# Patient Record
Sex: Male | Born: 1940
Health system: Southern US, Community
[De-identification: ages and names within clinical notes are randomized; demographics above are authoritative.]

## PROBLEM LIST (undated history)

## (undated) DIAGNOSIS — N529 Male erectile dysfunction, unspecified: Secondary | ICD-10-CM

## (undated) DIAGNOSIS — K5790 Diverticulosis of intestine, part unspecified, without perforation or abscess without bleeding: Secondary | ICD-10-CM

## (undated) DIAGNOSIS — S4990XA Unspecified injury of shoulder and upper arm, unspecified arm, initial encounter: Secondary | ICD-10-CM

## (undated) DIAGNOSIS — C189 Malignant neoplasm of colon, unspecified: Secondary | ICD-10-CM

## (undated) DIAGNOSIS — Z8669 Personal history of other diseases of the nervous system and sense organs: Secondary | ICD-10-CM

## (undated) DIAGNOSIS — G4700 Insomnia, unspecified: Secondary | ICD-10-CM

## (undated) DIAGNOSIS — S83209A Unspecified tear of unspecified meniscus, current injury, unspecified knee, initial encounter: Secondary | ICD-10-CM

## (undated) DIAGNOSIS — H9190 Unspecified hearing loss, unspecified ear: Secondary | ICD-10-CM

## (undated) DIAGNOSIS — R350 Frequency of micturition: Secondary | ICD-10-CM

## (undated) DIAGNOSIS — R351 Nocturia: Secondary | ICD-10-CM

## (undated) DIAGNOSIS — E785 Hyperlipidemia, unspecified: Secondary | ICD-10-CM

## (undated) DIAGNOSIS — M7020 Olecranon bursitis, unspecified elbow: Secondary | ICD-10-CM

## (undated) DIAGNOSIS — Z8719 Personal history of other diseases of the digestive system: Secondary | ICD-10-CM

## (undated) DIAGNOSIS — W19XXXA Unspecified fall, initial encounter: Secondary | ICD-10-CM

## (undated) DIAGNOSIS — Z9221 Personal history of antineoplastic chemotherapy: Secondary | ICD-10-CM

## (undated) DIAGNOSIS — D179 Benign lipomatous neoplasm, unspecified: Secondary | ICD-10-CM

## (undated) DIAGNOSIS — C61 Malignant neoplasm of prostate: Secondary | ICD-10-CM

## (undated) DIAGNOSIS — M1990 Unspecified osteoarthritis, unspecified site: Secondary | ICD-10-CM

## (undated) DIAGNOSIS — H269 Unspecified cataract: Secondary | ICD-10-CM

## (undated) DIAGNOSIS — Z923 Personal history of irradiation: Secondary | ICD-10-CM

## (undated) DIAGNOSIS — H7090 Unspecified mastoiditis, unspecified ear: Secondary | ICD-10-CM

## (undated) DIAGNOSIS — K219 Gastro-esophageal reflux disease without esophagitis: Secondary | ICD-10-CM

## (undated) DIAGNOSIS — Z85038 Personal history of other malignant neoplasm of large intestine: Secondary | ICD-10-CM

## (undated) DIAGNOSIS — J309 Allergic rhinitis, unspecified: Secondary | ICD-10-CM

## (undated) HISTORY — PX: TONSILLECTOMY: SUR1361

## (undated) HISTORY — PX: OTHER SURGICAL HISTORY: SHX169

## (undated) HISTORY — DX: Allergic rhinitis, unspecified: J30.9

## (undated) HISTORY — DX: Diverticulosis of intestine, part unspecified, without perforation or abscess without bleeding: K57.90

## (undated) HISTORY — PX: VASECTOMY: SHX75

## (undated) HISTORY — PX: COLONOSCOPY: SHX174

## (undated) HISTORY — DX: Gastro-esophageal reflux disease without esophagitis: K21.9

## (undated) HISTORY — DX: Olecranon bursitis, unspecified elbow: M70.20

## (undated) HISTORY — DX: Benign lipomatous neoplasm, unspecified: D17.9

## (undated) HISTORY — DX: Hyperlipidemia, unspecified: E78.5

## (undated) HISTORY — DX: Malignant neoplasm of prostate: C61

## (undated) HISTORY — PX: PROSTATE BIOPSY: SHX241

## (undated) HISTORY — PX: KNEE ARTHROSCOPY: SUR90

## (undated) HISTORY — DX: Insomnia, unspecified: G47.00

---

## 1990-05-18 DIAGNOSIS — C189 Malignant neoplasm of colon, unspecified: Secondary | ICD-10-CM

## 1990-05-18 HISTORY — DX: Malignant neoplasm of colon, unspecified: C18.9

## 1992-05-18 HISTORY — PX: COLON SURGERY: SHX602

## 1999-04-24 ENCOUNTER — Encounter: Payer: Self-pay | Admitting: Hematology and Oncology

## 1999-04-24 ENCOUNTER — Encounter: Admission: RE | Admit: 1999-04-24 | Discharge: 1999-04-24 | Payer: Self-pay | Admitting: Hematology and Oncology

## 2001-04-18 ENCOUNTER — Ambulatory Visit (HOSPITAL_COMMUNITY): Admission: RE | Admit: 2001-04-18 | Discharge: 2001-04-18 | Payer: Self-pay | Admitting: Gastroenterology

## 2012-10-04 ENCOUNTER — Other Ambulatory Visit: Payer: Self-pay | Admitting: Internal Medicine

## 2012-10-04 DIAGNOSIS — R1310 Dysphagia, unspecified: Secondary | ICD-10-CM

## 2012-10-17 ENCOUNTER — Ambulatory Visit
Admission: RE | Admit: 2012-10-17 | Discharge: 2012-10-17 | Disposition: A | Discharge status: Home / Self Care | Payer: Medicare Other | Source: Ambulatory Visit | Attending: Internal Medicine | Admitting: Internal Medicine

## 2012-10-17 DIAGNOSIS — R1310 Dysphagia, unspecified: Secondary | ICD-10-CM

## 2014-04-17 DIAGNOSIS — C61 Malignant neoplasm of prostate: Secondary | ICD-10-CM | POA: Insufficient documentation

## 2014-05-03 ENCOUNTER — Other Ambulatory Visit (HOSPITAL_COMMUNITY): Payer: Self-pay | Admitting: Urology

## 2014-05-03 DIAGNOSIS — C61 Malignant neoplasm of prostate: Secondary | ICD-10-CM

## 2014-05-16 ENCOUNTER — Encounter (HOSPITAL_COMMUNITY)
Admission: RE | Admit: 2014-05-16 | Discharge: 2014-05-16 | Disposition: A | Discharge status: Home / Self Care | Payer: Medicare Other | Source: Ambulatory Visit | Attending: Urology | Admitting: Urology

## 2014-05-16 DIAGNOSIS — C61 Malignant neoplasm of prostate: Secondary | ICD-10-CM | POA: Diagnosis not present

## 2014-05-16 MED ORDER — TECHNETIUM TC 99M MEDRONATE IV KIT
25.6000 | PACK | Freq: Once | INTRAVENOUS | Status: AC | PRN
  Administered 2014-05-16: 25.6 via INTRAVENOUS

## 2014-05-18 DIAGNOSIS — C61 Malignant neoplasm of prostate: Secondary | ICD-10-CM

## 2014-05-18 HISTORY — DX: Malignant neoplasm of prostate: C61

## 2014-05-23 ENCOUNTER — Other Ambulatory Visit (HOSPITAL_COMMUNITY): Payer: Self-pay | Admitting: Urology

## 2014-05-23 ENCOUNTER — Ambulatory Visit (HOSPITAL_COMMUNITY)
Admission: RE | Admit: 2014-05-23 | Discharge: 2014-05-23 | Disposition: A | Discharge status: Home / Self Care | Payer: Medicare Other | Source: Ambulatory Visit | Attending: Urology | Admitting: Urology

## 2014-05-23 DIAGNOSIS — C61 Malignant neoplasm of prostate: Secondary | ICD-10-CM | POA: Diagnosis not present

## 2014-05-23 DIAGNOSIS — M24152 Other articular cartilage disorders, left hip: Secondary | ICD-10-CM | POA: Diagnosis not present

## 2014-05-23 DIAGNOSIS — Z8546 Personal history of malignant neoplasm of prostate: Secondary | ICD-10-CM | POA: Diagnosis not present

## 2014-06-11 NOTE — Progress Notes (Signed)
Radiation Oncology         [phone]) [phone] ________________________________  Initial outpatient Consultation  Name: Steven Weeks  MRN: [id number]  Date: 06/13/2014  DOB: Oct 13, 1940  VF:IEPP,IRJJOACZYS R, MD  Claybon Jabs, MD   REFERRING PHYSICIAN: Claybon Jabs, MD  DIAGNOSIS: 74 y.o. gentleman with stage T2a adenocarcinoma of the prostate with a Gleason's score of 4+4 and a PSA of 7.645    ICD-9-CM ICD-10-CM   1. Stage T2a Adenocarcinoma of the Prostate with a Gleason's Score of 4+4 and a PSA of 7.65 185 C61     HISTORY OF PRESENT ILLNESS::Steven Weeks is a 74 y.o. gentleman.  He was noted to have an elevated PSA of 5.5 with short interval follow-up of 7.645 by his primary care physician, Dr. Dagmar Hait.  Accordingly, he was referred for evaluation in urology by Dr. Karsten Ro and digital rectal examination was performed at that time revealing a left apical nodule.  The patient proceeded to transrectal ultrasound with 12 biopsies of the prostate on 04/17/14.  The prostate volume measured 56.82 cc.  A hypoechoic region was noted on the left side, but, appeared to be confined to the prostate capsule.  Out of 12 core biopsies, 4 were positive.  The maximum Gleason score was 4+4, and this was seen in the graphical depiction below on the left side.  The patient reviewed the biopsy results with his urologist and he has kindly been referred today for discussion of potential radiation treatment options.  He was started on Lupron on 05/22/14  PREVIOUS RADIATION THERAPY: No  PAST MEDICAL HISTORY:  has a past medical history of Prostate cancer; Diverticulosis; Allergic rhinitis; GERD (gastroesophageal reflux disease); Hyperlipidemia; Insomnia; Malignant neoplasm of colon; Arthritis; Multiple lipomas; Olecranon bursitis; and S/P chemotherapy, time since greater than 12 weeks.    PAST SURGICAL HISTORY: Past Surgical History  Procedure Laterality Date  . Prostate biopsy    . Knee arthroscopy      with  medial meniscus repair  . Colon resection      due to malignancy    FAMILY HISTORY: family history includes Cancer in his father and mother; Hypertension in his mother.  SOCIAL HISTORY:  reports that he quit smoking about 33 years ago. His smoking use included Cigarettes. He does not have any smokeless tobacco history on file. He reports that he drinks about 2.4 oz of alcohol per week. He reports that he does not use illicit drugs.  ALLERGIES: Review of patient's allergies indicates no known allergies.  MEDICATIONS:  Current Outpatient Prescriptions  Medication Sig Dispense Refill  . aspirin 81 MG tablet Take 81 mg by mouth daily.    Marland Kitchen ibuprofen (ADVIL,MOTRIN) 800 MG tablet Take 800 mg by mouth every 8 (eight) hours as needed.    Marland Kitchen Leuprolide Acetate, 6 Month, (LUPRON) 45 MG injection Inject 45 mg into the muscle every 6 (six) months.    Marland Kitchen omeprazole (PRILOSEC) 20 MG capsule Take 20 mg by mouth daily.    . sildenafil (VIAGRA) 25 MG tablet Take 25 mg by mouth daily as needed for erectile dysfunction.    . temazepam (RESTORIL) 15 MG capsule Take 15 mg by mouth at bedtime as needed for sleep.     No current facility-administered medications for this encounter.    REVIEW OF SYSTEMS:  A 15 point review of systems is documented in the electronic medical record. This was obtained by the nursing staff. However, I reviewed this with the patient to discuss relevant  findings and make appropriate changes.  A comprehensive review of systems was negative..  The patient completed an IPSS and IIEF questionnaire.  His IPSS score was 2 indicating mild urinary outflow obstructive symptoms.  He indicated that his erectile function is not a priority.   PHYSICAL EXAM: This patient is in no acute distress.  He is alert and oriented.   weight is 201 lb 11.2 oz (91.491 kg). His blood pressure is 163/81 and his pulse is 71. His respiration is 16 and oxygen saturation is 100%.  He exhibits no respiratory distress or  labored breathing.  He appears neurologically intact.  His mood is pleasant.  His affect is appropriate.  Please note the digital rectal exam findings described above.  KPS = 100  100 - Normal; no complaints; no evidence of disease. 90   - Able to carry on normal activity; minor signs or symptoms of disease. 80   - Normal activity with effort; some signs or symptoms of disease. 49   - Cares for self; unable to carry on normal activity or to do active work. 60   - Requires occasional assistance, but is able to care for most of his personal needs. 50   - Requires considerable assistance and frequent medical care. 25   - Disabled; requires special care and assistance. 78   - Severely disabled; hospital admission is indicated although death not imminent. 47   - Very sick; hospital admission necessary; active supportive treatment necessary. 10   - Moribund; fatal processes progressing rapidly. 0     - Dead  Karnofsky DA, Abelmann [place], Craver LS and Burchenal JH [phone]) The use of the nitrogen mustards in the palliative treatment of carcinoma: with particular reference to bronchogenic carcinoma Cancer 1 634-56   LABORATORY DATA:  No results found for: WBC, HGB, HCT, MCV, PLT No results found for: NA, K, CL, CO2 No results found for: ALT, AST, GGT, ALKPHOS, BILITOT   RADIOGRAPHY: Nm Bone Scan Whole Body  05/16/2014   CLINICAL DATA:  Prostate cancer.  EXAM: NUCLEAR MEDICINE WHOLE BODY BONE SCAN  TECHNIQUE: Whole body anterior and posterior images were obtained approximately 3 hours after intravenous injection of radiopharmaceutical.  RADIOPHARMACEUTICALS:  25.6 mCi Technetium-99 MDP  COMPARISON:  CT 05/16/2014.  FINDINGS: Bilateral renal function and excretion. Intense increased activity noted over the right shoulder/ right humeral head. Right shoulder series suggested for further evaluation. These changes could be degenerative however metastatic disease cannot be excluded. Prominent activity noted  over the left hip. These changes could also be degenerative however metastatic disease cannot be excluded and left hip series suggested for further evaluation. Intense activity noted over the right knee, most likely degenerative, right knee series suggest for further evaluation.  IMPRESSION: 1. Intense activity noted of the right shoulder. 2. Prominent activity noted about the left hip. 3. Intense activity right knee. Although these findings may be secondary to degenerative changes. Right shoulder series, left hip series, and right knee series suggested for further evaluation as metastatic disease cannot be completely excluded.   Electronically Signed   By: Marcello Moores  Register   On: 05/16/2014 12:52   Dg Hip Unilat With Pelvis Min 4 Views Left  05/23/2014   CLINICAL DATA:  74 year old male with increased activity overlying the left hip on recent whole body bone scan. History of prostate cancer.  EXAM: DG HIP W/ PELVIS 4+V*L*  COMPARISON:  05/16/2014 bone scan and CT  FINDINGS: Mild to moderate degenerative changes in the left hip  per noted accounting for increased activity on recent bone scan. No focal bony lesions are identified.  Mild degenerative changes in the right hip are noted.  Degenerative changes in the lower lumbar spine are present.  IMPRESSION: Mild to moderate degenerative changes in the left hip accounting for increased activity on recent bone scan.   Electronically Signed   By: Hassan Rowan M.D.   On: 05/23/2014 12:54      IMPRESSION: This gentleman is a 74 y.o. gentleman with stage T2a adenocarcinoma of the prostate with a Gleason's score of 4+4 and a PSA of 7.645.  His Gleason's Score puts him into the high risk group.  Accordingly he is eligible for a variety of potential treatment options including external beam radiotherapy with or without seed implant boost using androgen deprivation for 2-3 years. In this setting, external radiation with seed implant has been shown to lead to superior clinical  outcomes with the trade-off of causing some increased urinary toxicity. The patient's low IPSS score indicates that he may tolerate a seed implant boost well.Marland Kitchen  PLAN:Today I reviewed the findings and workup thus far.  We discussed the natural history of prostate cancer.  We reviewed the the implications of T-stage, Gleason's Score, and PSA on decision-making and outcomes in prostate cancer.  We discussed radiation treatment in the management of prostate cancer with regard to the logistics and delivery of external beam radiation treatment as well as the logistics and delivery of prostate brachytherapy.  We compared and contrasted each of these approaches and also compared these against prostatectomy.  The patient expressed interest in external beam radiotherapy followed by seed implant boost.  I filled out a patient counseling form for him with relevant treatment diagrams and we retained a copy for our records.   The patient would like to proceed with prostate radiotherapy with external radiation followed by seed implant.  I will share my findings with Dr. Karsten Ro and move forward with scheduling placement of three gold fiducial markers into the prostate in mid to late February to proceed with external beam irradiation 2 months following initiation of androgen deprivation in early March.     I enjoyed meeting with him today, and will look forward to participating in the care of this very nice gentleman.   I spent 60 minutes face to face with the patient and more than 50% of that time was spent in counseling and/or coordination of care.   ------------------------------------------------  Sheral Apley. Tammi Klippel, M.D.

## 2014-06-12 ENCOUNTER — Encounter: Payer: Self-pay | Admitting: Radiation Oncology

## 2014-06-12 NOTE — Progress Notes (Signed)
GU Location of Tumor / Histology: adenocarcinoma of the prostate   If Prostate Cancer, Gleason Score is (4 + 4) and PSA is (7.65)  Steven Weeks was referred by Dr .Dagmar Hait to Dr. Karsten Ro for consult on 03/27/2014 after an elevated PSA was noted in October 2015.  Biopsies of prostate (if applicable) revealed:    Past/Anticipated interventions by urology, if any: biopsy, lupron administered 05/22/2014, referral to radiation oncology  Past/Anticipated interventions by medical oncology, if any: no  Weight changes, if any: no  Bowel/Bladder complaints, if any: nocturia and erectile dysfunction   Nausea/Vomiting, if any: no  Pain issues, if any:  Chronic orthopedic problems with right shoulder and right knee  SAFETY ISSUES:  Prior radiation?   Pacemaker/ICD? no  Possible current pregnancy? no  Is the patient on methotrexate? no  Current Complaints / other details:  74 year old male. Married. 2 sons and 1 daughter. Works in [employer]. Enjoys golf. Prostate volume:57 cc

## 2014-06-13 ENCOUNTER — Telehealth: Payer: Self-pay | Admitting: *Deleted

## 2014-06-13 ENCOUNTER — Ambulatory Visit
Admission: RE | Admit: 2014-06-13 | Discharge: 2014-06-13 | Disposition: A | Discharge status: Home / Self Care | Payer: Medicare Other | Source: Ambulatory Visit | Attending: Radiation Oncology | Admitting: Radiation Oncology

## 2014-06-13 ENCOUNTER — Encounter: Payer: Self-pay | Admitting: Radiation Oncology

## 2014-06-13 VITALS — BP 163/81 | HR 71 | Resp 16 | Wt 201.7 lb

## 2014-06-13 DIAGNOSIS — C61 Malignant neoplasm of prostate: Secondary | ICD-10-CM

## 2014-06-13 NOTE — Progress Notes (Signed)
See progress note under physician encounter. 

## 2014-06-13 NOTE — Addendum Note (Signed)
Encounter addended by: Lora Paula, MD on: 06/13/2014  4:03 PM<BR>     Documentation filed: Follow-up Section, LOS Section

## 2014-06-13 NOTE — Telephone Encounter (Signed)
CALLED PATIENT TO INFORM OF GOLD SEED PLACEMENT ON 06-28-14 - ARRIVALTIME - 3 PM @ DR. OTTELIN'S OFFICE AND HIS SIM ON 07-06-14 @ 9 AM @ DR. MANNING'S OFFICE, LVM FOR A RETURN CALL

## 2014-06-13 NOTE — Progress Notes (Signed)
Denies hot flashes or weight loss. Received Lupron on May 22, 2014. Denies dysuria or hematuria. Reports infrequent nocturia maybe 3-4 per week. Describes a strong steady urine stream. Denies difficulty emptying his bladder. Denies diarrhea or constipation. Denies referred pain to left testicle with bowel movements. Denies blood in stool. Reports that he "needs both knees replaced but, needs to detail with prostate ca first." Plays golf often.

## 2014-06-18 ENCOUNTER — Ambulatory Visit: Payer: Medicare Other | Admitting: Radiation Oncology

## 2014-06-18 ENCOUNTER — Ambulatory Visit: Payer: Medicare Other

## 2014-07-05 NOTE — Progress Notes (Signed)
  Radiation Oncology         [phone]) [phone] ________________________________  Name: Steven Weeks MRN: [id number]  Date: 07/06/2014  DOB: 1940/09/10  SIMULATION AND TREATMENT PLANNING NOTE  DIAGNOSIS:  74 yo man with    ICD-10-CM  Stage T2a Adenocarcinoma of the Prostate with a Gleason's Score of 4+4 and a PSA of 7.65 C61    NARRATIVE:  The patient was brought to the [facility].  Identity was confirmed.  All relevant records and images related to the planned course of therapy were reviewed.  The patient freely provided informed written consent to proceed with treatment after reviewing the details related to the planned course of therapy. The consent form was witnessed and verified by the simulation staff.  Then, the patient was set-up in a stable reproducible supine position for radiation therapy.  A vacuum lock pillow device was custom fabricated to position his legs in a reproducible immobilized position.  Then, I performed a urethrogram under sterile conditions to identify the prostatic apex.  CT images were obtained.  Surface markings were placed.  The CT images were loaded into the planning software.  Then the prostate target and avoidance structures including the rectum, bladder, bowel and hips were contoured.  Treatment planning then occurred.  The radiation prescription was entered and confirmed.  A total of one complex treatment device was fabricated in the form of the body fix pillow. I have requested a 3D plan with DVH's of the prostate, bladder, rectum, right femoral head and left femoral head.  I aslo requested daily image guidance with cone-beam CT to target the prostate with reduced error margins and ensure appropriate bladder and rectal filling.  PLAN:  The patient will receive 45 Gy in 25 fractions followed by seed implant.  ________________________________  Sheral Apley. Tammi Klippel, M.D.

## 2014-07-06 ENCOUNTER — Ambulatory Visit
Admission: RE | Admit: 2014-07-06 | Discharge: 2014-07-06 | Disposition: A | Discharge status: Home / Self Care | Payer: Medicare Other | Source: Ambulatory Visit | Attending: Radiation Oncology | Admitting: Radiation Oncology

## 2014-07-06 DIAGNOSIS — C61 Malignant neoplasm of prostate: Secondary | ICD-10-CM | POA: Diagnosis not present

## 2014-07-08 ENCOUNTER — Encounter: Payer: Self-pay | Admitting: Radiation Oncology

## 2014-07-08 NOTE — Progress Notes (Signed)
  Radiation Oncology         [phone]) [phone] ________________________________  Name: Steven Weeks MRN: [id number]  Date: 07/08/2014  DOB: 1940/07/09  SIMULATION AND TREATMENT PLANNING NOTE PUBIC ARCH STUDY  NO:IBBC,[id number] R, MD  No ref. provider found  DIAGNOSIS: 74 y.o. gentleman with stage T2a adenocarcinoma of the prostate with a Gleason's score of 4+4 and a PSA of 7.645   COMPLEX SIMULATION:  The patient presented Friday for external radiation planning and possible prostate seed implant boost. He was brought to the radiation planning suite and placed supine on the CT couch. A 3-dimensional image study set was obtained in upload to the planning computer. There, on each axial slice, I contoured the prostate gland. Then, using three-dimensional radiation planning tools I reconstructed the prostate in view of the structures from the transperineal needle pathway to assess for possible pubic arch interference. In doing so, I did not appreciate any pubic arch interference. Also, the patient's prostate volume was estimated based on the drawn structure. The volume was over 60 cc, but based on ultrasound volume, we will order fof 60 cc.  Given the pubic arch appearance and prostate volume, patient remains a good candidate to proceed with prostate seed implant as a boost. Today, he freely provided informed written consent to proceed.    PLAN: The patient will undergo prostate seed implant as a boost.   ________________________________  Sheral Apley. Tammi Klippel, M.D.

## 2014-07-11 DIAGNOSIS — C61 Malignant neoplasm of prostate: Secondary | ICD-10-CM | POA: Diagnosis not present

## 2014-07-13 ENCOUNTER — Ambulatory Visit
Admission: RE | Admit: 2014-07-13 | Discharge: 2014-07-13 | Disposition: A | Discharge status: Home / Self Care | Payer: Medicare Other | Source: Ambulatory Visit | Attending: Radiation Oncology | Admitting: Radiation Oncology

## 2014-07-13 DIAGNOSIS — C61 Malignant neoplasm of prostate: Secondary | ICD-10-CM | POA: Diagnosis not present

## 2014-07-16 ENCOUNTER — Ambulatory Visit
Admission: RE | Admit: 2014-07-16 | Discharge: 2014-07-16 | Disposition: A | Discharge status: Home / Self Care | Payer: Medicare Other | Source: Ambulatory Visit | Attending: Radiation Oncology | Admitting: Radiation Oncology

## 2014-07-16 DIAGNOSIS — C61 Malignant neoplasm of prostate: Secondary | ICD-10-CM | POA: Diagnosis not present

## 2014-07-17 ENCOUNTER — Ambulatory Visit
Admission: RE | Admit: 2014-07-17 | Discharge: 2014-07-17 | Disposition: A | Discharge status: Home / Self Care | Payer: Medicare Other | Source: Ambulatory Visit | Attending: Radiation Oncology | Admitting: Radiation Oncology

## 2014-07-17 DIAGNOSIS — C61 Malignant neoplasm of prostate: Secondary | ICD-10-CM | POA: Diagnosis not present

## 2014-07-18 ENCOUNTER — Ambulatory Visit
Admission: RE | Admit: 2014-07-18 | Discharge: 2014-07-18 | Disposition: A | Discharge status: Home / Self Care | Payer: Medicare Other | Source: Ambulatory Visit | Attending: Radiation Oncology | Admitting: Radiation Oncology

## 2014-07-18 DIAGNOSIS — C61 Malignant neoplasm of prostate: Secondary | ICD-10-CM | POA: Diagnosis not present

## 2014-07-19 ENCOUNTER — Ambulatory Visit
Admission: RE | Admit: 2014-07-19 | Discharge: 2014-07-19 | Disposition: A | Discharge status: Home / Self Care | Payer: Medicare Other | Source: Ambulatory Visit | Attending: Radiation Oncology | Admitting: Radiation Oncology

## 2014-07-19 DIAGNOSIS — C61 Malignant neoplasm of prostate: Secondary | ICD-10-CM | POA: Diagnosis not present

## 2014-07-20 ENCOUNTER — Ambulatory Visit
Admission: RE | Admit: 2014-07-20 | Discharge: 2014-07-20 | Disposition: A | Discharge status: Home / Self Care | Payer: Medicare Other | Source: Ambulatory Visit | Attending: Radiation Oncology | Admitting: Radiation Oncology

## 2014-07-20 ENCOUNTER — Encounter: Payer: Self-pay | Admitting: Radiation Oncology

## 2014-07-20 VITALS — BP 144/82 | HR 87 | Resp 16 | Wt 201.2 lb

## 2014-07-20 DIAGNOSIS — C61 Malignant neoplasm of prostate: Secondary | ICD-10-CM | POA: Diagnosis not present

## 2014-07-20 NOTE — Progress Notes (Addendum)
Denies dysuria or hematuria. Reports infrequent nocturia maybe 3-4 per week. Describes a strong steady urine stream. Denies difficulty emptying his bladder. Denies diarrhea or constipation. Weight and vitals are stable.  Oriented patient to staff and routine of the clinic. Provided patient with RADIATION THERAPY AND YOU handbook then, reviewed pertinent information. Educated patient reference potential side effects and management such as fatigue, skin changes, diarrhea, and urinary/bladder changes. Allowed patient opportunity to ask questions and answered those to the best of my ability. Patient verbalized understanding of all reviewed.

## 2014-07-20 NOTE — Progress Notes (Signed)
   Department of Radiation Oncology  Phone:  [phone] Fax:        [phone]  Weekly Treatment Note    Name: Steven Weeks Date: 07/20/2014 MRN: [id number] DOB: January 12, 1941   Current dose: 9 Gy  Current fraction: 5   MEDICATIONS: Current Outpatient Prescriptions  Medication Sig Dispense Refill  . aspirin 81 MG tablet Take 81 mg by mouth daily.    Marland Kitchen ibuprofen (ADVIL,MOTRIN) 800 MG tablet Take 800 mg by mouth every 8 (eight) hours as needed.    Marland Kitchen Leuprolide Acetate, 6 Month, (LUPRON) 45 MG injection Inject 45 mg into the muscle every 6 (six) months.    Marland Kitchen omeprazole (PRILOSEC) 20 MG capsule Take 20 mg by mouth daily.    . sildenafil (VIAGRA) 25 MG tablet Take 25 mg by mouth daily as needed for erectile dysfunction.    . temazepam (RESTORIL) 15 MG capsule Take 15 mg by mouth at bedtime as needed for sleep.     No current facility-administered medications for this encounter.     ALLERGIES: Review of patient's allergies indicates no known allergies.   LABORATORY DATA:  No results found for: WBC, HGB, HCT, MCV, PLT No results found for: NA, K, CL, CO2 No results found for: ALT, AST, GGT, ALKPHOS, BILITOT   NARRATIVE: Steven Weeks was seen today for weekly treatment management. The chart was checked and the patient's films were reviewed.  Denies dysuria or hematuria. Reports infrequent nocturia maybe 3-4 per week. Describes a strong steady urine stream. Denies difficulty emptying his bladder. Denies diarrhea or constipation. Weight and vitals are stable.  Oriented patient to staff and routine of the clinic. Provided patient with RADIATION THERAPY AND YOU handbook then, reviewed pertinent information. Educated patient reference potential side effects and management such as fatigue, skin changes, diarrhea, and urinary/bladder changes. Allowed patient opportunity to ask questions and answered those to the best of my ability. Patient verbalized understanding of all reviewed.      PHYSICAL EXAMINATION: weight is 201 lb 3.2 oz (91.264 kg). His blood pressure is 144/82 and his pulse is 87. His respiration is 16.        ASSESSMENT: The patient is doing satisfactorily with treatment.  PLAN: We will continue with the patient's radiation treatment as planned.

## 2014-07-23 ENCOUNTER — Ambulatory Visit
Admission: RE | Admit: 2014-07-23 | Discharge: 2014-07-23 | Disposition: A | Discharge status: Home / Self Care | Payer: Medicare Other | Source: Ambulatory Visit | Attending: Radiation Oncology | Admitting: Radiation Oncology

## 2014-07-23 DIAGNOSIS — C61 Malignant neoplasm of prostate: Secondary | ICD-10-CM | POA: Diagnosis not present

## 2014-07-24 ENCOUNTER — Ambulatory Visit
Admission: RE | Admit: 2014-07-24 | Discharge: 2014-07-24 | Disposition: A | Discharge status: Home / Self Care | Payer: Medicare Other | Source: Ambulatory Visit | Attending: Radiation Oncology | Admitting: Radiation Oncology

## 2014-07-24 DIAGNOSIS — C61 Malignant neoplasm of prostate: Secondary | ICD-10-CM | POA: Diagnosis not present

## 2014-07-25 ENCOUNTER — Ambulatory Visit
Admission: RE | Admit: 2014-07-25 | Discharge: 2014-07-25 | Disposition: A | Discharge status: Home / Self Care | Payer: Medicare Other | Source: Ambulatory Visit | Attending: Radiation Oncology | Admitting: Radiation Oncology

## 2014-07-25 DIAGNOSIS — C61 Malignant neoplasm of prostate: Secondary | ICD-10-CM | POA: Diagnosis not present

## 2014-07-26 ENCOUNTER — Ambulatory Visit
Admission: RE | Admit: 2014-07-26 | Discharge: 2014-07-26 | Disposition: A | Discharge status: Home / Self Care | Payer: Medicare Other | Source: Ambulatory Visit | Attending: Radiation Oncology | Admitting: Radiation Oncology

## 2014-07-26 DIAGNOSIS — C61 Malignant neoplasm of prostate: Secondary | ICD-10-CM | POA: Diagnosis not present

## 2014-07-27 ENCOUNTER — Encounter: Payer: Self-pay | Admitting: Radiation Oncology

## 2014-07-27 ENCOUNTER — Ambulatory Visit
Admission: RE | Admit: 2014-07-27 | Discharge: 2014-07-27 | Disposition: A | Discharge status: Home / Self Care | Payer: Medicare Other | Source: Ambulatory Visit | Attending: Radiation Oncology | Admitting: Radiation Oncology

## 2014-07-27 VITALS — BP 115/68 | HR 92 | Resp 16 | Wt 200.1 lb

## 2014-07-27 DIAGNOSIS — C61 Malignant neoplasm of prostate: Secondary | ICD-10-CM | POA: Diagnosis not present

## 2014-07-27 NOTE — Progress Notes (Addendum)
Denies hematuria or dysuria. Reports one episode of loose stool yesterday but, none today. Reports hot flashes related to Lupron continues. Nocturia x1. Weight and vitals stable. Denies pain. Played golf four times this week.

## 2014-07-27 NOTE — Progress Notes (Signed)
  Radiation Oncology         [phone]) [phone] ________________________________  Name: Steven Weeks MRN: [id number]  Date: 07/27/2014  DOB: 1941-04-14    Weekly Radiation Therapy Management    ICD-9-CM ICD-10-CM   1. Stage T2a Adenocarcinoma of the Prostate with a Gleason's Score of 4+4 and a PSA of 7.65 185 C61     Current Dose: 18 Gy     Planned Dose:  45 Gy plus seed implant boost  Narrative . . . . . . . . The patient presents for routine under treatment assessment.                                   Denies hematuria or dysuria. Reports one episode of loose stool yesterday but, none today. Reports hot flashes related to Lupron continues. Nocturia x1. Weight and vitals stable. Denies pain. Played golf four times this week.                                 Set-up films were reviewed.                                 The chart was checked. Physical Findings. . .  weight is 200 lb 1.6 oz (90.765 kg). His blood pressure is 115/68 and his pulse is 92. His respiration is 16. . Weight essentially stable.  No significant changes. Impression . . . . . . . The patient is tolerating radiation. Plan . . . . . . . . . . . . Continue treatment as planned.  ________________________________  Sheral Apley. Tammi Klippel, M.D.

## 2014-07-30 ENCOUNTER — Ambulatory Visit
Admission: RE | Admit: 2014-07-30 | Discharge: 2014-07-30 | Disposition: A | Discharge status: Home / Self Care | Payer: Medicare Other | Source: Ambulatory Visit | Attending: Radiation Oncology | Admitting: Radiation Oncology

## 2014-07-30 DIAGNOSIS — C61 Malignant neoplasm of prostate: Secondary | ICD-10-CM | POA: Diagnosis not present

## 2014-07-31 ENCOUNTER — Ambulatory Visit
Admission: RE | Admit: 2014-07-31 | Discharge: 2014-07-31 | Disposition: A | Discharge status: Home / Self Care | Payer: Medicare Other | Source: Ambulatory Visit | Attending: Radiation Oncology | Admitting: Radiation Oncology

## 2014-07-31 DIAGNOSIS — C61 Malignant neoplasm of prostate: Secondary | ICD-10-CM | POA: Diagnosis not present

## 2014-08-01 ENCOUNTER — Ambulatory Visit
Admission: RE | Admit: 2014-08-01 | Discharge: 2014-08-01 | Disposition: A | Discharge status: Home / Self Care | Payer: Medicare Other | Source: Ambulatory Visit | Attending: Radiation Oncology | Admitting: Radiation Oncology

## 2014-08-01 DIAGNOSIS — C61 Malignant neoplasm of prostate: Secondary | ICD-10-CM | POA: Diagnosis not present

## 2014-08-02 ENCOUNTER — Ambulatory Visit
Admission: RE | Admit: 2014-08-02 | Discharge: 2014-08-02 | Disposition: A | Discharge status: Home / Self Care | Payer: Medicare Other | Source: Ambulatory Visit | Attending: Radiation Oncology | Admitting: Radiation Oncology

## 2014-08-02 VITALS — BP 131/67 | HR 83 | Temp 98.0°F | Resp 16 | Wt 198.1 lb

## 2014-08-02 DIAGNOSIS — C61 Malignant neoplasm of prostate: Secondary | ICD-10-CM

## 2014-08-02 NOTE — Progress Notes (Signed)
Reports one episode last week of loose stool. Reports one night with nocturia x 5 that since resolved. Reports he is voiding more frequently during the day but, unable to provide time frame. Denies dysuria or hematuria. Denies fatigue. Weight and vitals stable.

## 2014-08-02 NOTE — Progress Notes (Signed)
  Radiation Oncology         [phone]) [phone] ________________________________  Name: Steven Ramsay RN: [id number]  Date: 08/02/2014  DOB: January 04, 1941    Weekly Radiation Therapy Management    ICD-9-CM ICD-10-CM   1. Stage T2a Adenocarcinoma of the Prostate with a Gleason's Score of 4+4 and a PSA of 7.65 185 C61     Current Dose: 25.2 Gy     Planned Dose:  45 Gy plus seed implant boost  Narrative . . . . . . . . The patient presents for routine under treatment assessment.                                   Reports one episode last week of loose stool. Reports one night with nocturia x 5 that since resolved. Reports he is voiding more frequently during the day but, unable to provide time frame. Denies dysuria or hematuria. Denies fatigue. Weight and vitals stable                                 Set-up films were reviewed.                                 The chart was checked. Physical Findings. . .  weight is 198 lb 1.6 oz (89.858 kg). His oral temperature is 98 F (36.7 C). His blood pressure is 131/67 and his pulse is 83. His respiration is 16 and oxygen saturation is 100%. . Weight essentially stable.  No significant changes. Impression . . . . . . . The patient is tolerating radiation. Plan . . . . . . . . . . . . Continue treatment as planned.  ________________________________  Sheral Apley. Tammi Klippel, M.D.

## 2014-08-03 ENCOUNTER — Ambulatory Visit
Admission: RE | Admit: 2014-08-03 | Discharge: 2014-08-03 | Disposition: A | Discharge status: Home / Self Care | Payer: Medicare Other | Source: Ambulatory Visit | Attending: Radiation Oncology | Admitting: Radiation Oncology

## 2014-08-03 ENCOUNTER — Ambulatory Visit: Payer: Medicare Other | Admitting: Radiation Oncology

## 2014-08-03 DIAGNOSIS — C61 Malignant neoplasm of prostate: Secondary | ICD-10-CM | POA: Diagnosis not present

## 2014-08-06 ENCOUNTER — Other Ambulatory Visit: Payer: Self-pay | Admitting: Urology

## 2014-08-06 ENCOUNTER — Ambulatory Visit
Admission: RE | Admit: 2014-08-06 | Discharge: 2014-08-06 | Disposition: A | Discharge status: Home / Self Care | Payer: Medicare Other | Source: Ambulatory Visit | Attending: Radiation Oncology | Admitting: Radiation Oncology

## 2014-08-06 ENCOUNTER — Telehealth: Payer: Self-pay | Admitting: *Deleted

## 2014-08-06 DIAGNOSIS — C61 Malignant neoplasm of prostate: Secondary | ICD-10-CM | POA: Diagnosis not present

## 2014-08-06 NOTE — Telephone Encounter (Signed)
CALLED PATIENT TO ASK QUESTION, LVM FOR A RETURN CALL 

## 2014-08-07 ENCOUNTER — Telehealth: Payer: Self-pay | Admitting: *Deleted

## 2014-08-07 ENCOUNTER — Encounter (HOSPITAL_BASED_OUTPATIENT_CLINIC_OR_DEPARTMENT_OTHER)
Admission: RE | Admit: 2014-08-07 | Discharge: 2014-08-07 | Disposition: A | Discharge status: Home / Self Care | Payer: Medicare Other | Source: Ambulatory Visit | Attending: Urology | Admitting: Urology

## 2014-08-07 ENCOUNTER — Ambulatory Visit
Admission: RE | Admit: 2014-08-07 | Discharge: 2014-08-07 | Disposition: A | Discharge status: Home / Self Care | Payer: Medicare Other | Source: Ambulatory Visit | Attending: Radiation Oncology | Admitting: Radiation Oncology

## 2014-08-07 ENCOUNTER — Ambulatory Visit (HOSPITAL_BASED_OUTPATIENT_CLINIC_OR_DEPARTMENT_OTHER)
Admission: RE | Admit: 2014-08-07 | Discharge: 2014-08-07 | Disposition: A | Discharge status: Home / Self Care | Payer: Medicare Other | Source: Ambulatory Visit | Attending: Urology | Admitting: Urology

## 2014-08-07 DIAGNOSIS — C61 Malignant neoplasm of prostate: Secondary | ICD-10-CM | POA: Diagnosis not present

## 2014-08-07 DIAGNOSIS — Z01818 Encounter for other preprocedural examination: Secondary | ICD-10-CM | POA: Insufficient documentation

## 2014-08-07 DIAGNOSIS — Z87891 Personal history of nicotine dependence: Secondary | ICD-10-CM | POA: Diagnosis not present

## 2014-08-07 NOTE — Telephone Encounter (Signed)
CALLED PATIENT TO ASK QUESTION, LVM FOR A RETURN CALL 

## 2014-08-08 ENCOUNTER — Ambulatory Visit
Admission: RE | Admit: 2014-08-08 | Discharge: 2014-08-08 | Disposition: A | Discharge status: Home / Self Care | Payer: Medicare Other | Source: Ambulatory Visit | Attending: Radiation Oncology | Admitting: Radiation Oncology

## 2014-08-08 DIAGNOSIS — C61 Malignant neoplasm of prostate: Secondary | ICD-10-CM | POA: Diagnosis not present

## 2014-08-09 ENCOUNTER — Ambulatory Visit
Admission: RE | Admit: 2014-08-09 | Discharge: 2014-08-09 | Disposition: A | Discharge status: Home / Self Care | Payer: Medicare Other | Source: Ambulatory Visit | Attending: Radiation Oncology | Admitting: Radiation Oncology

## 2014-08-09 ENCOUNTER — Encounter: Payer: Self-pay | Admitting: Radiation Oncology

## 2014-08-09 VITALS — BP 113/60 | HR 75 | Resp 16 | Wt 199.3 lb

## 2014-08-09 DIAGNOSIS — C61 Malignant neoplasm of prostate: Secondary | ICD-10-CM

## 2014-08-09 NOTE — Progress Notes (Signed)
  Radiation Oncology         [phone]) [phone] ________________________________  Name: Steven Ramsay RN: [id number]  Date: 08/09/2014  DOB: 08-31-1940    Weekly Radiation Therapy Management    ICD-9-CM ICD-10-CM   1. Stage T2a Adenocarcinoma of the Prostate with a Gleason's Score of 4+4 and a PSA of 7.65 185 C61     Current Dose: 34.2 Gy     Planned Dose:  45 Gy plus seed implant boost  Narrative . . . . . . . . The patient presents for routine under treatment assessment.                                   Weight and vitals stable. Remains active playing golf daily. Reports nocturia x 1 which is an increase from none. Reports increased urinary frequency during the day. Denies diarrhea. Denies fatigue or pain. Reports hot flashes continue. Denies dysuria or hematuria.                                   Set-up films were reviewed.                                 The chart was checked. Physical Findings. . .  weight is 199 lb 4.8 oz (90.402 kg). His blood pressure is 113/60 and his pulse is 75. His respiration is 16. . Weight essentially stable.  No significant changes. Impression . . . . . . . The patient is tolerating radiation. Plan . . . . . . . . . . . . Continue treatment as planned.  ________________________________  Sheral Apley. Tammi Klippel, M.D.

## 2014-08-09 NOTE — Progress Notes (Signed)
Weight and vitals stable. Remains active playing golf daily. Reports nocturia x 1 which is an increase from none. Reports increased urinary frequency during the day. Denies diarrhea. Denies fatigue or pain. Reports hot flashes continue. Denies dysuria or hematuria.

## 2014-08-10 ENCOUNTER — Ambulatory Visit
Admission: RE | Admit: 2014-08-10 | Discharge: 2014-08-10 | Disposition: A | Discharge status: Home / Self Care | Payer: Medicare Other | Source: Ambulatory Visit | Attending: Radiation Oncology | Admitting: Radiation Oncology

## 2014-08-10 ENCOUNTER — Ambulatory Visit: Payer: Medicare Other | Admitting: Radiation Oncology

## 2014-08-10 DIAGNOSIS — C61 Malignant neoplasm of prostate: Secondary | ICD-10-CM | POA: Diagnosis not present

## 2014-08-13 ENCOUNTER — Ambulatory Visit
Admission: RE | Admit: 2014-08-13 | Discharge: 2014-08-13 | Disposition: A | Discharge status: Home / Self Care | Payer: Medicare Other | Source: Ambulatory Visit | Attending: Radiation Oncology | Admitting: Radiation Oncology

## 2014-08-13 DIAGNOSIS — C61 Malignant neoplasm of prostate: Secondary | ICD-10-CM | POA: Diagnosis not present

## 2014-08-14 ENCOUNTER — Ambulatory Visit
Admission: RE | Admit: 2014-08-14 | Discharge: 2014-08-14 | Disposition: A | Discharge status: Home / Self Care | Payer: Medicare Other | Source: Ambulatory Visit | Attending: Radiation Oncology | Admitting: Radiation Oncology

## 2014-08-14 DIAGNOSIS — C61 Malignant neoplasm of prostate: Secondary | ICD-10-CM | POA: Diagnosis not present

## 2014-08-15 ENCOUNTER — Ambulatory Visit
Admission: RE | Admit: 2014-08-15 | Discharge: 2014-08-15 | Disposition: A | Discharge status: Home / Self Care | Payer: Medicare Other | Source: Ambulatory Visit | Attending: Radiation Oncology | Admitting: Radiation Oncology

## 2014-08-15 DIAGNOSIS — C61 Malignant neoplasm of prostate: Secondary | ICD-10-CM | POA: Diagnosis not present

## 2014-08-16 ENCOUNTER — Ambulatory Visit
Admission: RE | Admit: 2014-08-16 | Discharge: 2014-08-16 | Disposition: A | Discharge status: Home / Self Care | Payer: Medicare Other | Source: Ambulatory Visit | Attending: Radiation Oncology | Admitting: Radiation Oncology

## 2014-08-16 DIAGNOSIS — C61 Malignant neoplasm of prostate: Secondary | ICD-10-CM | POA: Diagnosis not present

## 2014-08-17 ENCOUNTER — Ambulatory Visit: Payer: Medicare Other | Admitting: Radiation Oncology

## 2014-08-17 ENCOUNTER — Ambulatory Visit: Payer: Medicare Other

## 2014-08-17 ENCOUNTER — Ambulatory Visit
Admission: RE | Admit: 2014-08-17 | Discharge: 2014-08-17 | Disposition: A | Discharge status: Home / Self Care | Payer: Medicare Other | Source: Ambulatory Visit | Attending: Radiation Oncology | Admitting: Radiation Oncology

## 2014-08-20 ENCOUNTER — Ambulatory Visit
Admission: RE | Admit: 2014-08-20 | Discharge: 2014-08-20 | Disposition: A | Discharge status: Home / Self Care | Payer: Medicare Other | Source: Ambulatory Visit | Attending: Radiation Oncology | Admitting: Radiation Oncology

## 2014-08-20 ENCOUNTER — Encounter: Payer: Self-pay | Admitting: Radiation Oncology

## 2014-08-20 DIAGNOSIS — C61 Malignant neoplasm of prostate: Secondary | ICD-10-CM

## 2014-08-20 NOTE — Progress Notes (Signed)
  Radiation Oncology         [phone] ________________________________  Name: Steven Ramsay RN: [id number]  Date: 08/20/2014  DOB: 1940/05/29    Weekly Radiation Therapy Management    ICD-9-CM ICD-10-CM   1. Stage T2a Adenocarcinoma of the Prostate with a Gleason's Score of 4+4 and a PSA of 7.65 185 C61     Current Dose: 45 Gy     Planned Dose:  45 Gy plus seed implant boost  Narrative . . . . . . . . The patient presents for routine under treatment assessment.                                   Weight and vitals stable. Remains active playing golf daily. Reports nocturia x 1 which is an increase from none. Reports increased urinary frequency during the day. Denies diarrhea. Denies fatigue or pain. Reports hot flashes continue. Denies dysuria or hematuria.                                   Set-up films were reviewed.                                 The chart was checked. Physical Findings. . . . Weight essentially stable.  No significant changes. Impression . . . . . . . The patient is tolerating radiation. Plan . . . . . . . . . . . . Continue treatment as planned with seed implant on 09/07/14.  ________________________________  Sheral Apley. Tammi Klippel, M.D.

## 2014-08-23 NOTE — Progress Notes (Signed)
  Radiation Oncology         [phone]) [phone] ________________________________  Name: EISA NECAISE MRN: [id number]  Date: 08/20/2014  DOB: [birth year]  End of Treatment Note  DIAGNOSIS: 74 yo man with Stage T2a Adenocarcinoma of the Prostate with a Gleason's Score of 4+4 and a PSA of 7.65  ICD-10-CM   C61       Indication for treatment:  Curative       Radiation treatment dates:   07/16/2014-08/20/2014  Site/dose:    1.  Initially, the prostate was treated with external radiotherapy to 45 Gy in 25 fractions of 1.8 Gy 2.  Then, prostate seed implant was used to boost the dose with 110 Gy for total of 155 Gy.  Beams/energy:      1.  Initially, the prostate was treated with external radiotherapy using a 4-field technique, with 3D conformal AP, PA, Rt and Lt fields with 15 MV X-rays and daily CT guidance. 2.  Then, prostate seed implant was used to boost the dose using Iodine-125 seeds.  Narrative: The patient tolerated radiation treatment relatively well.     Plan: The patient has completed radiation treatment. The patient will return to radiation oncology clinic for routine followup in one month. I advised him to call or return sooner if he has any questions or concerns related to his recovery or treatment. ________________________________  Sheral Apley. Tammi Klippel, M.D.

## 2014-08-30 ENCOUNTER — Telehealth: Payer: Self-pay | Admitting: *Deleted

## 2014-08-30 NOTE — Telephone Encounter (Signed)
CALLED PATIENT TO REMIND OF APPT. FOR 08-31-14, LVM FOR A RETURN CALL

## 2014-08-31 ENCOUNTER — Ambulatory Visit: Payer: Medicare Other

## 2014-09-03 DIAGNOSIS — K219 Gastro-esophageal reflux disease without esophagitis: Secondary | ICD-10-CM | POA: Diagnosis not present

## 2014-09-03 DIAGNOSIS — G4700 Insomnia, unspecified: Secondary | ICD-10-CM | POA: Diagnosis not present

## 2014-09-03 DIAGNOSIS — Z85038 Personal history of other malignant neoplasm of large intestine: Secondary | ICD-10-CM | POA: Diagnosis not present

## 2014-09-03 DIAGNOSIS — Z7982 Long term (current) use of aspirin: Secondary | ICD-10-CM | POA: Diagnosis not present

## 2014-09-03 DIAGNOSIS — Z87891 Personal history of nicotine dependence: Secondary | ICD-10-CM | POA: Diagnosis not present

## 2014-09-03 DIAGNOSIS — Z79899 Other long term (current) drug therapy: Secondary | ICD-10-CM | POA: Diagnosis not present

## 2014-09-03 DIAGNOSIS — M1990 Unspecified osteoarthritis, unspecified site: Secondary | ICD-10-CM | POA: Diagnosis not present

## 2014-09-03 DIAGNOSIS — C61 Malignant neoplasm of prostate: Secondary | ICD-10-CM | POA: Diagnosis present

## 2014-09-03 LAB — COMPREHENSIVE METABOLIC PANEL
ALT: 23 U/L (ref 0–53)
AST: 27 U/L (ref 0–37)
Albumin: 4.4 g/dL (ref 3.5–5.2)
Alkaline Phosphatase: 51 U/L (ref 39–117)
Anion gap: 8 (ref 5–15)
BUN: 26 mg/dL — ABNORMAL HIGH (ref 6–23)
CO2: 26 mmol/L (ref 19–32)
Calcium: 9.5 mg/dL (ref 8.4–10.5)
Chloride: 105 mmol/L (ref 96–112)
Creatinine, Ser: 1.27 mg/dL (ref 0.50–1.35)
GFR calc Af Amer: 62 mL/min — ABNORMAL LOW (ref >90)
GFR calc non Af Amer: 54 mL/min — ABNORMAL LOW (ref >90)
Glucose, Bld: 101 mg/dL — ABNORMAL HIGH (ref 70–99)
Potassium: 4.9 mmol/L (ref 3.5–5.1)
Sodium: 139 mmol/L (ref 135–145)
Total Bilirubin: 0.5 mg/dL (ref 0.3–1.2)
Total Protein: 7.5 g/dL (ref 6.0–8.3)

## 2014-09-03 LAB — CBC
HCT: 39 % (ref 39.0–52.0)
Hemoglobin: 13.1 g/dL (ref 13.0–17.0)
MCH: 31 pg (ref 26.0–34.0)
MCHC: 33.6 g/dL (ref 30.0–36.0)
MCV: 92.2 fL (ref 78.0–100.0)
Platelets: 215 10*3/uL (ref 150–400)
RBC: 4.23 MIL/uL (ref 4.22–5.81)
RDW: 12.7 % (ref 11.5–15.5)
WBC: 5.8 10*3/uL (ref 4.0–10.5)

## 2014-09-03 LAB — PROTIME-INR
INR: 0.98 (ref 0.00–1.49)
Prothrombin Time: 13.1 seconds (ref 11.6–15.2)

## 2014-09-03 LAB — APTT: aPTT: 28 seconds (ref 24–37)

## 2014-09-05 ENCOUNTER — Encounter (HOSPITAL_BASED_OUTPATIENT_CLINIC_OR_DEPARTMENT_OTHER): Payer: Self-pay | Admitting: *Deleted

## 2014-09-05 NOTE — Progress Notes (Addendum)
SPOKE W/ WIFE. NPO AFTER MN. ARRIVE AT 0800. CURRENT LAB RESULT, CXR, AND EKG IN CHART AND EPIC. WILL TAKE PRILOSEC AM DOS W/ SIPS OF WATER AND FLEET ENEMA.

## 2014-09-06 ENCOUNTER — Telehealth: Payer: Self-pay | Admitting: *Deleted

## 2014-09-06 NOTE — Telephone Encounter (Signed)
CALLED PATIENT TO REMIND OF IMPLANT ON 09-07-14, LVM FOR A RETURN CALL

## 2014-09-07 ENCOUNTER — Encounter (HOSPITAL_BASED_OUTPATIENT_CLINIC_OR_DEPARTMENT_OTHER)
Admission: RE | Disposition: A | Discharge status: Home / Self Care | Payer: Self-pay | Source: Ambulatory Visit | Attending: Urology

## 2014-09-07 ENCOUNTER — Ambulatory Visit (HOSPITAL_BASED_OUTPATIENT_CLINIC_OR_DEPARTMENT_OTHER): Payer: Medicare Other | Admitting: Anesthesiology

## 2014-09-07 ENCOUNTER — Encounter (HOSPITAL_BASED_OUTPATIENT_CLINIC_OR_DEPARTMENT_OTHER): Payer: Self-pay

## 2014-09-07 ENCOUNTER — Ambulatory Visit (HOSPITAL_COMMUNITY): Payer: Medicare Other

## 2014-09-07 ENCOUNTER — Ambulatory Visit (HOSPITAL_BASED_OUTPATIENT_CLINIC_OR_DEPARTMENT_OTHER)
Admission: RE | Admit: 2014-09-07 | Discharge: 2014-09-07 | Disposition: A | Discharge status: Home / Self Care | Payer: Medicare Other | Source: Ambulatory Visit | Attending: Urology | Admitting: Urology

## 2014-09-07 DIAGNOSIS — K219 Gastro-esophageal reflux disease without esophagitis: Secondary | ICD-10-CM | POA: Diagnosis not present

## 2014-09-07 DIAGNOSIS — C61 Malignant neoplasm of prostate: Secondary | ICD-10-CM | POA: Insufficient documentation

## 2014-09-07 DIAGNOSIS — G4700 Insomnia, unspecified: Secondary | ICD-10-CM | POA: Diagnosis not present

## 2014-09-07 DIAGNOSIS — M1990 Unspecified osteoarthritis, unspecified site: Secondary | ICD-10-CM | POA: Diagnosis not present

## 2014-09-07 DIAGNOSIS — Z85038 Personal history of other malignant neoplasm of large intestine: Secondary | ICD-10-CM | POA: Insufficient documentation

## 2014-09-07 DIAGNOSIS — Z87891 Personal history of nicotine dependence: Secondary | ICD-10-CM | POA: Insufficient documentation

## 2014-09-07 DIAGNOSIS — Z7982 Long term (current) use of aspirin: Secondary | ICD-10-CM | POA: Insufficient documentation

## 2014-09-07 DIAGNOSIS — Z79899 Other long term (current) drug therapy: Secondary | ICD-10-CM | POA: Insufficient documentation

## 2014-09-07 HISTORY — DX: Nocturia: R35.1

## 2014-09-07 HISTORY — PX: RADIOACTIVE SEED IMPLANT: SHX5150

## 2014-09-07 HISTORY — DX: Personal history of other malignant neoplasm of large intestine: Z85.038

## 2014-09-07 HISTORY — DX: Male erectile dysfunction, unspecified: N52.9

## 2014-09-07 HISTORY — DX: Unspecified osteoarthritis, unspecified site: M19.90

## 2014-09-07 HISTORY — DX: Personal history of other diseases of the digestive system: Z87.19

## 2014-09-07 HISTORY — DX: Personal history of irradiation: Z92.3

## 2014-09-07 HISTORY — DX: Frequency of micturition: R35.0

## 2014-09-07 SURGERY — INSERTION, RADIATION SOURCE, PROSTATE
Anesthesia: General | Site: Prostate

## 2014-09-07 MED ORDER — FLEET ENEMA 7-19 GM/118ML RE ENEM
1.0000 | ENEMA | Freq: Once | RECTAL | Status: DC
  Filled 2014-09-07: qty 1

## 2014-09-07 MED ORDER — FENTANYL CITRATE (PF) 100 MCG/2ML IJ SOLN
INTRAMUSCULAR | Status: DC | PRN
  Administered 2014-09-07: 50 ug via INTRAVENOUS
  Administered 2014-09-07 (×3): 25 ug via INTRAVENOUS
  Administered 2014-09-07: 50 ug via INTRAVENOUS
  Administered 2014-09-07: 25 ug via INTRAVENOUS

## 2014-09-07 MED ORDER — HYDROCODONE-ACETAMINOPHEN 10-325 MG PO TABS: 1.0000 | ORAL_TABLET | ORAL | Status: DC | PRN

## 2014-09-07 MED ORDER — CIPROFLOXACIN IN D5W 400 MG/200ML IV SOLN
400.0000 mg | INTRAVENOUS | Status: AC
  Administered 2014-09-07: 400 mg via INTRAVENOUS
  Filled 2014-09-07: qty 200

## 2014-09-07 MED ORDER — LACTATED RINGERS IV SOLN
INTRAVENOUS | Status: DC
  Administered 2014-09-07 (×2): via INTRAVENOUS
  Filled 2014-09-07: qty 1000

## 2014-09-07 MED ORDER — CIPROFLOXACIN IN D5W 400 MG/200ML IV SOLN
INTRAVENOUS | Status: AC
  Filled 2014-09-07: qty 200

## 2014-09-07 MED ORDER — ACETAMINOPHEN 10 MG/ML IV SOLN
INTRAVENOUS | Status: DC | PRN
  Administered 2014-09-07: 1000 mg via INTRAVENOUS

## 2014-09-07 MED ORDER — ONDANSETRON HCL 4 MG/2ML IJ SOLN
INTRAMUSCULAR | Status: DC | PRN
Start: 2014-09-07 — End: 2014-09-07
  Administered 2014-09-07: 4 mg via INTRAVENOUS

## 2014-09-07 MED ORDER — FENTANYL CITRATE (PF) 100 MCG/2ML IJ SOLN
INTRAMUSCULAR | Status: AC
  Filled 2014-09-07: qty 4

## 2014-09-07 MED ORDER — LIDOCAINE HCL (CARDIAC) 20 MG/ML IV SOLN
INTRAVENOUS | Status: DC | PRN
  Administered 2014-09-07 (×2): 50 mg via INTRAVENOUS

## 2014-09-07 MED ORDER — STERILE WATER FOR IRRIGATION IR SOLN
Status: DC | PRN
  Administered 2014-09-07: 500 mL
  Administered 2014-09-07: 3000 mL

## 2014-09-07 MED ORDER — PROPOFOL 10 MG/ML IV BOLUS
INTRAVENOUS | Status: DC | PRN
  Administered 2014-09-07: 200 mg via INTRAVENOUS

## 2014-09-07 MED ORDER — IOHEXOL 350 MG/ML SOLN
INTRAVENOUS | Status: DC | PRN
  Administered 2014-09-07: 7 mL

## 2014-09-07 MED ORDER — CIPROFLOXACIN HCL 500 MG PO TABS: 500.0000 mg | ORAL_TABLET | Freq: Two times a day (BID) | ORAL | Status: DC

## 2014-09-07 MED ORDER — DEXAMETHASONE SODIUM PHOSPHATE 4 MG/ML IJ SOLN
INTRAMUSCULAR | Status: DC | PRN
  Administered 2014-09-07: 10 mg via INTRAVENOUS

## 2014-09-07 SURGICAL SUPPLY — 25 items
BAG URINE DRAINAGE (UROLOGICAL SUPPLIES) ×2 IMPLANT
BLADE CLIPPER SURG (BLADE) ×2 IMPLANT
CATH FOLEY 2WAY SLVR  5CC 16FR (CATHETERS) ×2
CATH FOLEY 2WAY SLVR 5CC 16FR (CATHETERS) ×2 IMPLANT
CATH ROBINSON RED A/P 20FR (CATHETERS) ×2 IMPLANT
CLOTH BEACON ORANGE TIMEOUT ST (SAFETY) ×2 IMPLANT
COVER BACK TABLE 60X90IN (DRAPES) ×2 IMPLANT
COVER MAYO STAND STRL (DRAPES) ×2 IMPLANT
DRSG TEGADERM 4X4.75 (GAUZE/BANDAGES/DRESSINGS) ×2 IMPLANT
DRSG TEGADERM 8X12 (GAUZE/BANDAGES/DRESSINGS) ×2 IMPLANT
GLOVE BIO SURGEON STRL SZ 6.5 (GLOVE) ×2 IMPLANT
GLOVE BIO SURGEON STRL SZ8 (GLOVE) ×4 IMPLANT
GLOVE BIOGEL PI IND STRL 6.5 (GLOVE) ×2 IMPLANT
GLOVE BIOGEL PI INDICATOR 6.5 (GLOVE) ×2
GLOVE ECLIPSE 8.0 STRL XLNG CF (GLOVE) ×8 IMPLANT
GOWN STRL REUS W/ TWL XL LVL3 (GOWN DISPOSABLE) ×3 IMPLANT
GOWN STRL REUS W/TWL XL LVL3 (GOWN DISPOSABLE) ×3
HOLDER FOLEY CATH W/STRAP (MISCELLANEOUS) ×2 IMPLANT
PACK CYSTO (CUSTOM PROCEDURE TRAY) ×2 IMPLANT
SPONGE GAUZE 4X4 12PLY STER LF (GAUZE/BANDAGES/DRESSINGS) ×2 IMPLANT
SYRINGE 10CC LL (SYRINGE) ×2 IMPLANT
UNDERPAD 30X30 INCONTINENT (UNDERPADS AND DIAPERS) ×4 IMPLANT
WATER STERILE IRR 3000ML UROMA (IV SOLUTION) ×2 IMPLANT
WATER STERILE IRR 500ML POUR (IV SOLUTION) ×2 IMPLANT
selectSeed I-125 ×108 IMPLANT

## 2014-09-07 NOTE — H&P (Signed)
[name] is a 74 year old male who returns status post TURS/BX for elevated PSA.   History of Present Illness Elevated PSA: He was found to have an elevated PSA in 10/15 of 7.65. He said his PSA done in 5/15 was about 5.5. There is no family history of prostate cancer. A nodule was noted in the left apex on DRE.   TRUS/BX 04/17/14: His prostate measured 57 cc. I did note an area of hypoechogenicity in the left apex and mid prostate laterally. The capsule appeared intact.  Pathology: Adenocarcinoma Gleason score 4+4 = 8 in 4/12 cores from the left lobe laterally. Stage T2a.    Interval history: He had no difficulties following his prostate biopsy.   Past Medical History Problems  1. History of Diverticulosis (K57.90) 2. History of allergic rhinitis (Z87.09) 3. History of esophageal reflux (Z87.19) 4. History of hyperlipidemia (Z86.39) 5. History of insomnia (Z87.898) 6. History of malignant neoplasm of colon (Z85.038) 7. History of osteoarthritis (Z87.39) 8. History of IFG (impaired fasting glucose) (R73.01) 9. History of Multiple lipomas (D17.9) 10. History of Olecranon bursitis, right (M70.21)  Surgical History Problems  1. History of Knee Arthroscopy With Medial Meniscus Repair 2. History of Partial Colectomy  Current Meds 1. Aspirin 81 MG Oral Tablet;  Therapy: (Recorded:10Nov2015) to Recorded 2. Omeprazole CPDR;  Therapy: (Recorded:10Nov2015) to Recorded 3. Temazepam CAPS; TAKE CAPSULE  PRN;  Therapy: (Recorded:10Nov2015) to Recorded 4. Viagra TABS; TAKE TABLET  PRN;  Therapy: (Recorded:10Nov2015) to Recorded  Allergies Medication  1. No Known Drug Allergies  Family History Problems  1. Family history of Deceased : Mother, Father   deeased at age 69 2. Family history of colon cancer (Z80.0) : Mother 3. Family history of hypertension (Z82.49) : Mother 4. Family history of lung cancer (Z80.1) : Father 5. Family history of malignant neoplasm of urinary  bladder (Z80.52) : Father  Social History Problems  1. Alcohol use (F10.99)   4 2. Caffeine use (F15.90)   1 3. Former smoker [phone])   quit smoking in 1983 4. Married 5. Number of children   2 sons; 1 daughter 46. Occupation   Press photographer  Review of Systems Genitourinary, constitutional, skin, eye, otolaryngeal, hematologic/lymphatic, cardiovascular, pulmonary, endocrine, musculoskeletal, gastrointestinal, neurological and psychiatric system(s) were reviewed and pertinent findings if present are noted.  Genitourinary: nocturia and erectile dysfunction.  Musculoskeletal: joint pain.    Vitals Vital Signs  Height: 5 ft 11 in Weight: 195 lb  BMI Calculated: 27.2 BSA Calculated: 2.09 Blood Pressure: 132 / 71 Heart Rate: 70  Physical Exam Constitutional: Well nourished and well developed . No acute distress.  ENT:. The ears and nose are normal in appearance.  Neck: The appearance of the neck is normal and no neck mass is present.  Pulmonary: No respiratory distress and normal respiratory rhythm and effort.  Cardiovascular: Heart rate and rhythm are normal . No peripheral edema.  Abdomen: The abdomen is soft and nontender. No masses are palpated. No CVA tenderness. No hernias are palpable. No hepatosplenomegaly noted.  Rectal: Rectal exam demonstrates normal sphincter tone, no tenderness and no masses. Estimated prostate size is 1+. The prostate has a palpable nodule involving the left, apex of the prostate and is not tender. The left seminal vesicle is nonpalpable. The right seminal vesicle is nonpalpable. The perineum is normal on inspection.  Genitourinary: Examination of the penis demonstrates no discharge, no masses, no lesions and a normal meatus. The scrotum is without lesions. The right epididymis  is palpably normal and non-tender. The left epididymis is palpably normal and non-tender. The right testis is non-tender and without masses. The left testis is non-tender and  without masses.  Lymphatics: The femoral and inguinal nodes are not enlarged or tender.  Skin: Normal skin turgor, no visible rash and no visible skin lesions.  Neuro/Psych:. Mood and affect are appropriate.   Impression:  Adenocarcinoma of the prostate.  He underwent IMRT and will then undergo I-125 seed implantation for his organ confined (CT scan and bone scan negative) Gleason 8 adenocarcinoma of the prostate involving the left lobe.  Plan: IMRT now for I-125 seed implant.

## 2014-09-07 NOTE — Anesthesia Postprocedure Evaluation (Signed)
  Anesthesia Post-op Note  Patient: Steven Weeks  Procedure(s) Performed: Procedure(s) (LRB): RADIOACTIVE SEED IMPLANT (N/A)  Patient Location: PACU  Anesthesia Type: General  Level of Consciousness: awake and alert   Airway and Oxygen Therapy: Patient Spontanous Breathing  Post-op Pain: mild  Post-op Assessment: Post-op Vital signs reviewed, Patient's Cardiovascular Status Stable, Respiratory Function Stable, Patent Airway and No signs of Nausea or vomiting  Last Vitals:  Filed Vitals:   09/07/14 1215  BP: 143/78  Pulse: 70  Temp:   Resp: 18    Post-op Vital Signs: stable   Complications: No apparent anesthesia complications

## 2014-09-07 NOTE — Transfer of Care (Signed)
Immediate Anesthesia Transfer of Care Note  Patient: Steven Weeks  Procedure(s) Performed: Procedure(s): RADIOACTIVE SEED IMPLANT (N/A)  Patient Location: PACU  Anesthesia Type:General  Level of Consciousness: awake, alert , oriented and patient cooperative  Airway & Oxygen Therapy: Patient Spontanous Breathing and Patient connected to face mask oxygen  Post-op Assessment: Report given to RN and Post -op Vital signs reviewed and stable  Post vital signs: Reviewed and stable  Last Vitals:  Filed Vitals:   09/07/14 0824  BP: 145/76  Pulse: 78  Temp: 36.3 C  Resp: 16    Complications: No apparent anesthesia complications

## 2014-09-07 NOTE — Discharge Instructions (Signed)
DISCHARGE INSTRUCTIONS FOR PROSTATE SEED IMPLANTATION  Removal of catheter Remove the foley catheter after 24 hours ( day after the procedure).can be done easily by cutting the side port of the catheter, whichallow the balloon to deflate.  You will see 1-2 teaspoons of clear water as the balloon deflates and then the catheter can be slid out without difficulty.        Cut here  Antibiotics You may be given a prescription for an antibiotic to take when you arrive home. If so, be sure to take every tablet in the bottle, even if you are feeling better before the prescription is finished. If you begin itching, notice a rash or start to swell on your trunk, arms, legs and/or throat, immediately stop taking the antibiotic and call your Urologist. Diet Resume your usual diet when you return home. To keep your bowels moving easily and softly, drink prune, apple and cranberry juice at room temperature. You may also take a stool softener, such as Colace, which is available without prescription at local pharmacies. Daily activities ? No driving or heavy lifting for at least two days after the implant. ? No bike riding, horseback riding or riding lawn mowers for the first month after the implant. ? Any strenuous physical activity should be approved by your doctor before you resume it. Sexual relations You may resume sexual relations two weeks after the procedure. A condom should be used for the first two weeks. Your semen may be dark brown or black; this is normal and is related bleeding that may have occurred during the implant. Postoperative swelling Expect swelling and bruising of the scrotum and perineum (the area between the scrotum and anus). Both the swelling and the bruising should resolve in l or 2 weeks. Ice packs and over- the-counter medications such as Tylenol, Advil or Aleve may lessen your discomfort. Postoperative urination Most men experience burning on urination and/or urinary frequency.  If this becomes bothersome, contact your Urologist.  Medication can be prescribed to relieve these problems.  It is normal to have some blood in your urine for a few days after the implant. Special instructions related to the seeds It is unlikely that you will pass an Iodine-125 seed in your urine. The seeds are silver in color and are about as large as a grain of rice. If you pass a seed, do not handle it with your fingers. Use a spoon to place it in an envelope or jar in place this in base occluded area such as the garage or basement for return to the radiation clinic at your convenience.  Contact your doctor for ? Temperature greater than 101 F ? Increasing pain ? Inability to urinate Follow-up  You should have follow up with your urologist and radiation oncologist about 3 weeks after the procedure. General information regarding Iodine seeds ? Iodine-125 is a low energy radioactive material. It is not deeply penetrating and loses energy at short distances. Your prostate will absorb the radiation. Objects that are touched or used by the patient do not become radioactive. ? Body wastes (urine and stool) or body fluids (saliva, tears, semen or blood) are not radioactive. ? The Nuclear Regulatory Commission [facility]) has determined that no radiation precautions are needed for patients undergoing Iodine-125 seed implantation. The [facility] states that such patients do not present a risk to the people around them, including young children and pregnant women. However, in keeping with the general principle that radiation exposure should be kept as low reasonably  possible, we suggest the following: ? Children and pets should not sit on the patient's lap for the first two (2) weeks after the implant. ? Pregnant (or possibly pregnant) women should avoid prolonged, close contact with the patient for the first two (2) weeks after the implant. ? A distance of three (3) feet is acceptable. ? At a distance of three (3)  feet, there is no limit to the length of time anyone can be with the patient.   Post Anesthesia Home Care Instructions  Activity: Get plenty of rest for the remainder of the day. A responsible adult should stay with you for 24 hours following the procedure.  For the next 24 hours, DO NOT: -Drive a car -Paediatric nurse -Drink alcoholic beverages -Take any medication unless instructed by your physician -Make any legal decisions or sign important papers.  Meals: Start with liquid foods such as gelatin or soup. Progress to regular foods as tolerated. Avoid greasy, spicy, heavy foods. If nausea and/or vomiting occur, drink only clear liquids until the nausea and/or vomiting subsides. Call your physician if vomiting continues.  Special Instructions/Symptoms: Your throat may feel dry or sore from the anesthesia or the breathing tube placed in your throat during surgery. If this causes discomfort, gargle with warm salt water. The discomfort should disappear within 24 hours.  If you had a scopolamine patch placed behind your ear for the management of post- operative nausea and/or vomiting:  1. The medication in the patch is effective for 72 hours, after which it should be removed.  Wrap patch in a tissue and discard in the trash. Wash hands thoroughly with soap and water. 2. You may remove the patch earlier than 72 hours if you experience unpleasant side effects which may include dry mouth, dizziness or visual disturbances. 3. Avoid touching the patch. Wash your hands with soap and water after contact with the patch.    Post Anesthesia Home Care Instructions  Activity: Get plenty of rest for the remainder of the day. A responsible adult should stay with you for 24 hours following the procedure.  For the next 24 hours, DO NOT: -Drive a car -Paediatric nurse -Drink alcoholic beverages -Take any medication unless instructed by your physician -Make any legal decisions or sign important  papers.  Meals: Start with liquid foods such as gelatin or soup. Progress to regular foods as tolerated. Avoid greasy, spicy, heavy foods. If nausea and/or vomiting occur, drink only clear liquids until the nausea and/or vomiting subsides. Call your physician if vomiting continues.  Special Instructions/Symptoms: Your throat may feel dry or sore from the anesthesia or the breathing tube placed in your throat during surgery. If this causes discomfort, gargle with warm salt water. The discomfort should disappear within 24 hours.  If you had a scopolamine patch placed behind your ear for the management of post- operative nausea and/or vomiting:  1. The medication in the patch is effective for 72 hours, after which it should be removed.  Wrap patch in a tissue and discard in the trash. Wash hands thoroughly with soap and water. 2. You may remove the patch earlier than 72 hours if you experience unpleasant side effects which may include dry mouth, dizziness or visual disturbances. 3. Avoid touching the patch. Wash your hands with soap and water after contact with the patch.

## 2014-09-07 NOTE — Anesthesia Procedure Notes (Signed)
Procedure Name: LMA Insertion Date/Time: 09/07/2014 7:36 AM Performed by: Mechele Claude Pre-anesthesia Checklist: Patient identified, Emergency Drugs available, Suction available and Patient being monitored Patient Re-evaluated:Patient Re-evaluated prior to inductionOxygen Delivery Method: Circle System Utilized Preoxygenation: Pre-oxygenation with 100% oxygen Intubation Type: IV induction Ventilation: Mask ventilation without difficulty LMA: LMA inserted LMA Size: 5.0 Number of attempts: 1 Airway Equipment and Method: bite block Placement Confirmation: positive ETCO2 Tube secured with: Tape Dental Injury: Teeth and Oropharynx as per pre-operative assessment

## 2014-09-07 NOTE — Anesthesia Preprocedure Evaluation (Signed)
Anesthesia Evaluation  Patient identified by MRN, date of birth, ID band Patient awake    Reviewed: Allergy & Precautions, NPO status , Patient's Chart, lab work & pertinent test results  Airway Mallampati: II  TM Distance: >3 FB Neck ROM: Full    Dental no notable dental hx.    Pulmonary neg pulmonary ROS, former smoker,  breath sounds clear to auscultation  Pulmonary exam normal       Cardiovascular negative cardio ROS  Rhythm:Regular Rate:Normal     Neuro/Psych negative neurological ROS  negative psych ROS   GI/Hepatic Neg liver ROS, hiatal hernia, GERD-  Medicated and Controlled,  Endo/Other  negative endocrine ROS  Renal/GU negative Renal ROS  negative genitourinary   Musculoskeletal negative musculoskeletal ROS (+)   Abdominal   Peds negative pediatric ROS (+)  Hematology negative hematology ROS (+)   Anesthesia Other Findings   Reproductive/Obstetrics negative OB ROS                             Anesthesia Physical Anesthesia Plan  ASA: II  Anesthesia Plan: General   Post-op Pain Management:    Induction: Intravenous  Airway Management Planned: LMA  Additional Equipment:   Intra-op Plan:   Post-operative Plan: Extubation in OR  Informed Consent: I have reviewed the patients History and Physical, chart, labs and discussed the procedure including the risks, benefits and alternatives for the proposed anesthesia with the patient or authorized representative who has indicated his/her understanding and acceptance.   Dental advisory given  Plan Discussed with: CRNA  Anesthesia Plan Comments:         Anesthesia Quick Evaluation

## 2014-09-07 NOTE — Op Note (Signed)
PATIENT:  Steven Weeks  PRE-OPERATIVE DIAGNOSIS:  Adenocarcinoma of the prostate  POST-OPERATIVE DIAGNOSIS:  Same  PROCEDURE:  Procedure(s): 1. I-125 radioactive seed implantation 2. Cystoscopy  SURGEON:  Surgeon(s): Claybon Jabs  Radiation oncologist: Dr. Tyler Pita  ANESTHESIA:  General  EBL:  Minimal  DRAINS: 61 French Foley catheter  INDICATION: Steven Weeks is a 73 year old male with biopsy-proven adenocarcinoma of the prostate Gleason score 4+4 = 8 in 4/12 cores from the left lobe laterally. Metastatic workup was negative. Stage TIIa. He has undergone IMRT and now presents for radioactive seed boost.  Description of procedure: After informed consent the patient was brought to the major OR, placed on the table and administered general anesthesia. He was then moved to the modified lithotomy position with his perineum perpendicular to the floor. His perineum and genitalia were then sterilely prepped. An official timeout was then performed. A 16 French Foley catheter was then placed in the bladder and filled with dilute contrast, a rectal tube was placed in the rectum and the transrectal ultrasound probe was placed in the rectum and affixed to the stand. He was then sterilely draped.  Real time ultrasonography was used along with the seed planning software [employer] Prostate vs. 4.2.21. This was used to develop the seed plan including the number of needles as well as number of seeds required for complete and adequate coverage. Real-time ultrasonography was then used along with the previously developed plan and the Nucletron device to implant a total of 54 seeds using 21 needles. This proceeded without difficulty or complication.  A Foley catheter was then removed as well as the transrectal ultrasound probe and rectal probe. Flexible cystoscopy was then performed using the 17 French flexible scope which revealed a normal urethra throughout its length down to the sphincter which  appeared intact. The prostatic urethra revealed bilobar hypertrophy but no evidence of obstruction, seeds, spacers or lesions. The bladder was then entered and fully and systematically inspected. The ureteral orifices were noted to be of normal configuration and position. The mucosa revealed no evidence of tumors. There were also no stones identified within the bladder. I noted no seeds or spacers on the floor of the bladder and retroflexion of the scope revealed no seeds protruding from the base of the prostate.  The cystoscope was then removed and a new 28 French Foley catheter was then inserted and the balloon was filled with 10 cc of sterile water. This was connected to closed system drainage and the patient was awakened and taken to recovery room in stable and satisfactory condition. He tolerated procedure well and there were no intraoperative complications.

## 2014-09-09 NOTE — Progress Notes (Signed)
  Radiation Oncology         [phone]) [phone] ________________________________  Name: Steven Weeks MRN: [id number]  Date: 09/09/2014  DOB: Jun 26, 1940       Prostate Seed Implant  [name],[name], MD  No ref. provider found  DIAGNOSIS: 74 y.o. gentleman with stage T2a adenocarcinoma of the prostate with a Gleason's score of 4+4 and a PSA of 7.645    ICD-9-CM ICD-10-CM   1. Prostate cancer 185 C61 DG Chest 2 View     DG Chest 2 View     Urinary leg bag     Discharge patient    PROCEDURE: Insertion of radioactive I-125 seeds into the prostate gland.  RADIATION DOSE: 145 Gy, definitive therapy.  TECHNIQUE: Steven Weeks was brought to the operating room with the urologist. He was placed in the dorsolithotomy position. He was catheterized and a rectal tube was inserted. The perineum was shaved, prepped and draped. The ultrasound probe was then introduced into the rectum to see the prostate gland.  TREATMENT DEVICE: A needle grid was attached to the ultrasound probe stand and anchor needles were placed.  3D PLANNING: The prostate was imaged in 3D using a sagittal sweep of the prostate probe. These images were transferred to the planning computer. There, the prostate, urethra and rectum were defined on each axial reconstructed image. Then, the software created an optimized 3D plan and a few seed positions were adjusted. The quality of the plan was reviewed using [facility] information for the target and the following two organs at risk:  Urethra and Rectum.  Then the accepted plan was uploaded to the seed Selectron afterloading unit.  PROSTATE VOLUME STUDY:  Using transrectal ultrasound the volume of the prostate was verified to be 41.4 cc.  SPECIAL TREATMENT PROCEDURE/SUPERVISION AND HANDLING: The Nucletron FIRST system was used to place the needles under sagittal guidance. A total of 21 needles were used to deposit 54 seeds in the prostate gland. The individual seed activity was 0.583  mCi.  COMPLEX SIMULATION: At the end of the procedure, an anterior radiograph of the pelvis was obtained to document seed positioning and count. Cystoscopy was performed to check the urethra and bladder.  MICRODOSIMETRY: At the end of the procedure, the patient was emitting 0.06 mR/hr at 1 meter. Accordingly, he was considered safe for hospital discharge.  PLAN: The patient will return to the radiation oncology clinic for post implant CT dosimetry in three weeks.   ________________________________  Sheral Apley Tammi Klippel, M.D.

## 2014-09-10 ENCOUNTER — Encounter (HOSPITAL_BASED_OUTPATIENT_CLINIC_OR_DEPARTMENT_OTHER): Payer: Self-pay | Admitting: Urology

## 2014-10-03 ENCOUNTER — Telehealth: Payer: Self-pay | Admitting: *Deleted

## 2014-10-03 NOTE — Telephone Encounter (Signed)
Called patient to remind of appts. For 10-04-14, spoke with patient and he is aware of these appts.

## 2014-10-04 ENCOUNTER — Ambulatory Visit: Payer: Medicare Other | Admitting: Radiation Oncology

## 2014-10-04 ENCOUNTER — Encounter: Payer: Self-pay | Admitting: Radiation Oncology

## 2014-10-04 ENCOUNTER — Ambulatory Visit
Admission: RE | Admit: 2014-10-04 | Discharge: 2014-10-04 | Disposition: A | Discharge status: Home / Self Care | Payer: Medicare Other | Source: Ambulatory Visit | Attending: Radiation Oncology | Admitting: Radiation Oncology

## 2014-10-04 VITALS — BP 124/78 | HR 82 | Resp 16 | Wt 201.5 lb

## 2014-10-04 DIAGNOSIS — C61 Malignant neoplasm of prostate: Secondary | ICD-10-CM

## 2014-10-04 NOTE — Progress Notes (Signed)
Weight and vitals stable. Pre seed IPSS 2. Post seed IPSS 15. Urgency is his biggest complaint. Reports frequency has increased. Reports nocturia x 2. Denies dysuria. Denies hematuria.

## 2014-10-04 NOTE — Progress Notes (Signed)
Radiation Oncology         [phone]) [phone] ________________________________  Name: Steven Weeks  MRN: [id number]  Date: 10/04/2014  DOB: 10/17/1940    Follow-Up Visit Note  CC: Tivis Ringer, MD  Prince Solian, MD  Diagnosis:  Stage T2a adenocarcinoma of the prostate with a Gleason's score of 4+4 and a PSA of 7.645    ICD-9-CM ICD-10-CM   1. Prostate cancer 185 C61 DG Chest 2 View     DG Chest 2 View     Urinary leg bag     Discharge patient      ICD-9-CM ICD-10-CM   1. Stage T2a Adenocarcinoma of the Prostate with a Gleason's Score of 4+4 and a PSA of 7.65 185 C61     Interval Since Last Radiation:  3  weeks  Narrative:  The patient returns today for routine follow-up.  He is complaining of increased urinary frequency and urinary hesitation symptoms. He filled out a questionnaire regarding urinary function today providing and overall IPSS score of 15 characterizing his symptoms as moderate.  His pre-implant score was 2. He denies any bowel symptoms.  Weight and vitals stable. Pre seed IPSS 2. Post seed IPSS 15. Urgency is his biggest complaint. Reports frequency has increased. Reports nocturia x 2. Denies dysuria. Denies hematuria.  ALLERGIES:  has No Known Allergies.  Meds: Current Outpatient Prescriptions  Medication Sig Dispense Refill  . aspirin 81 MG tablet Take 81 mg by mouth daily.    . ciprofloxacin (CIPRO) 500 MG tablet Take 1 tablet (500 mg total) by mouth 2 (two) times daily. 6 tablet 0  . HYDROcodone-acetaminophen (NORCO) 10-325 MG per tablet Take 1-2 tablets by mouth every 4 (four) hours as needed for moderate pain. Maximum dose per 24 hours - 8 pills 16 tablet 0  . ibuprofen (ADVIL,MOTRIN) 800 MG tablet Take 800 mg by mouth every 8 (eight) hours as needed.    Marland Kitchen Leuprolide Acetate, 6 Month, (LUPRON) 45 MG injection Inject 45 mg into the muscle every 6 (six) months.    Marland Kitchen omeprazole (PRILOSEC) 20 MG capsule Take 20 mg by mouth every morning.     .  sildenafil (VIAGRA) 25 MG tablet Take 25 mg by mouth daily as needed for erectile dysfunction.    . temazepam (RESTORIL) 15 MG capsule Take 15 mg by mouth at bedtime as needed for sleep.     No current facility-administered medications for this encounter.    Physical Findings: The patient is in no acute distress. Patient is alert and oriented.  weight is 201 lb 8 oz (91.4 kg). His blood pressure is 124/78 and his pulse is 82. His respiration is 16. .  No significant changes  Lab Findings: Lab Results  Component Value Date   WBC 5.8 09/03/2014   HGB 13.1 09/03/2014   HCT 39.0 09/03/2014   MCV 92.2 09/03/2014   PLT 215 09/03/2014    Radiographic Findings:  Patient underwent CT imaging in our clinic for post implant dosimetry. The CT appears to demonstrate an adequate distribution of radioactive seeds throughout the prostate gland. There no seeds in her near the rectum. I suspect the final radiation plan and dosimetry will show appropriate coverage of the prostate gland.   Impression: The patient is recovering from the effects of radiation. His urinary symptoms should gradually improve over the next 4-6 months. We talked about this today. He is encouraged by his improvement already and is otherwise please with his outcome.   Plan: Today,  I spent time talking to the patient about his prostate seed implant and resolving urinary symptoms. We also talked about long-term follow-up for prostate cancer following seed implant. He understands that ongoing PSA determinations and digital rectal exams will help perform surveillance to rule out disease recurrence. He understands what to expect with his PSA measures. Patient was also educated today about some of the long-term effects from radiation including a small risk for rectal bleeding and possibly erectile dysfunction. We talked about some of the general management approaches to these potential complications. However, I did encourage the patient to  contact our office or return at any point if he has questions or concerns related to his previous radiation and prostate cancer.   This document serves as a record of services personally performed by Tyler Pita, MD. It was created on his behalf by Arlyce Harman, a trained medical scribe. The creation of this record is based on the scribe's personal observations and the provider's statements to them. This document has been checked and approved by the attending provider.     _____________________________________  Sheral Apley. Tammi Klippel, M.D.

## 2014-10-04 NOTE — Progress Notes (Signed)
  Radiation Oncology         [phone]) [phone] ________________________________  Name: Steven Weeks  MRN: [id number]  Date: 10/04/2014  DOB: [birth year]  COMPLEX SIMULATION NOTE  NARRATIVE:  The patient was brought to the [facility] today following prostate seed implantation approximately one month ago.  Identity was confirmed.  All relevant records and images related to the planned course of therapy were reviewed.  Then, the patient was set-up supine.  CT images were obtained.  The CT images were loaded into the planning software.  Then the prostate and rectum were contoured.  Treatment planning then occurred.  The implanted iodine 125 seeds were identified by the physics staff for projection of radiation distribution  I have requested : 3D Simulation  I have requested a DVH of the following structures: Prostate and rectum.    This document serves as a record of services personally performed by Tyler Pita, MD. It was created on his behalf by Arlyce Harman, a trained medical scribe. The creation of this record is based on the scribe's personal observations and the provider's statements to them. This document has been checked and approved by the attending provider.     ________________________________  Sheral Apley. Tammi Klippel, M.D.

## 2014-10-26 ENCOUNTER — Encounter: Payer: Self-pay | Admitting: Radiation Oncology

## 2014-10-26 DIAGNOSIS — C61 Malignant neoplasm of prostate: Secondary | ICD-10-CM | POA: Diagnosis not present

## 2014-10-28 NOTE — Progress Notes (Signed)
  Radiation Oncology         [phone]) [phone] ________________________________  Name: OLUWASEYI RAFFEL MRN: [id number]  Date: 10/26/2014  DOB: January 04, 1941  3D Planning Note   Prostate Brachytherapy Post-Implant Dosimetry  Diagnosis: Stage T2a adenocarcinoma of the prostate with a Gleason's score of 4+4 and a PSA of 7.645  Narrative: On a previous date, Steven Weeks returned following prostate seed implantation for post implant planning. He underwent CT scan complex simulation to delineate the three-dimensional structures of the pelvis and demonstrate the radiation distribution.  Since that time, the seed localization, and complex isodose planning with dose volume histograms have now been completed.  Results:   Prostate Coverage - The dose of radiation delivered to the 90% or more of the prostate gland (D90) was 92.31% of the prescription dose. This exceeds our goal of greater than 90%. Rectal Sparing - The volume of rectal tissue receiving the prescription dose or higher was 0.01 cc. This falls under our thresholds tolerance of 1.0 cc.  Impression: The prostate seed implant appears to show adequate target coverage and appropriate rectal sparing.  Plan:  The patient will continue to follow with urology for ongoing PSA determinations. I would anticipate a high likelihood for local tumor control with minimal risk for rectal morbidity.  ________________________________  Sheral Apley Tammi Klippel, M.D.

## 2015-04-10 ENCOUNTER — Encounter (HOSPITAL_COMMUNITY): Payer: Self-pay

## 2015-04-10 ENCOUNTER — Encounter (HOSPITAL_COMMUNITY)
Admission: RE | Admit: 2015-04-10 | Discharge: 2015-04-10 | Disposition: A | Discharge status: Home / Self Care | Payer: Medicare Other | Source: Ambulatory Visit | Attending: Orthopedic Surgery | Admitting: Orthopedic Surgery

## 2015-04-10 DIAGNOSIS — Z01818 Encounter for other preprocedural examination: Secondary | ICD-10-CM | POA: Diagnosis not present

## 2015-04-10 DIAGNOSIS — M179 Osteoarthritis of knee, unspecified: Secondary | ICD-10-CM | POA: Insufficient documentation

## 2015-04-10 HISTORY — DX: Personal history of antineoplastic chemotherapy: Z92.21

## 2015-04-10 HISTORY — DX: Unspecified tear of unspecified meniscus, current injury, unspecified knee, initial encounter: S83.209A

## 2015-04-10 HISTORY — DX: Unspecified mastoiditis, unspecified ear: H70.90

## 2015-04-10 HISTORY — DX: Malignant neoplasm of colon, unspecified: C18.9

## 2015-04-10 HISTORY — DX: Unspecified injury of shoulder and upper arm, unspecified arm, initial encounter: S49.90XA

## 2015-04-10 HISTORY — DX: Unspecified cataract: H26.9

## 2015-04-10 HISTORY — DX: Unspecified fall, initial encounter: W19.XXXA

## 2015-04-10 HISTORY — DX: Personal history of other diseases of the nervous system and sense organs: Z86.69

## 2015-04-10 HISTORY — DX: Unspecified hearing loss, unspecified ear: H91.90

## 2015-04-10 LAB — TYPE AND SCREEN
ABO/RH(D): B POS
Antibody Screen: NEGATIVE

## 2015-04-10 LAB — URINALYSIS, ROUTINE W REFLEX MICROSCOPIC
Bilirubin Urine: NEGATIVE
GLUCOSE, UA: NEGATIVE mg/dL
HGB URINE DIPSTICK: NEGATIVE
KETONES UR: NEGATIVE mg/dL
LEUKOCYTES UA: NEGATIVE
Nitrite: NEGATIVE
PROTEIN: NEGATIVE mg/dL
Specific Gravity, Urine: 1.018 (ref 1.005–1.030)
pH: 5.5 (ref 5.0–8.0)

## 2015-04-10 LAB — BASIC METABOLIC PANEL
ANION GAP: 7 (ref 5–15)
BUN: 22 mg/dL — ABNORMAL HIGH (ref 6–20)
CHLORIDE: 104 mmol/L (ref 101–111)
CO2: 28 mmol/L (ref 22–32)
Calcium: 9.7 mg/dL (ref 8.9–10.3)
Creatinine, Ser: 1.06 mg/dL (ref 0.61–1.24)
GFR calc Af Amer: >60 mL/min (ref >60)
GLUCOSE: 99 mg/dL (ref 65–99)
POTASSIUM: 4.6 mmol/L (ref 3.5–5.1)
Sodium: 139 mmol/L (ref 135–145)

## 2015-04-10 LAB — CBC
HEMATOCRIT: 38.8 % — AB (ref 39.0–52.0)
HEMOGLOBIN: 13.5 g/dL (ref 13.0–17.0)
MCH: 32.4 pg (ref 26.0–34.0)
MCHC: 34.8 g/dL (ref 30.0–36.0)
MCV: 93 fL (ref 78.0–100.0)
Platelets: 176 10*3/uL (ref 150–400)
RBC: 4.17 MIL/uL — ABNORMAL LOW (ref 4.22–5.81)
RDW: 12.4 % (ref 11.5–15.5)
WBC: 3.6 10*3/uL — ABNORMAL LOW (ref 4.0–10.5)

## 2015-04-10 LAB — PROTIME-INR
INR: 1 (ref 0.00–1.49)
Prothrombin Time: 13.4 seconds (ref 11.6–15.2)

## 2015-04-10 LAB — APTT: aPTT: 26 seconds (ref 24–37)

## 2015-04-10 LAB — ABO/RH: ABO/RH(D): B POS

## 2015-04-10 LAB — SURGICAL PCR SCREEN
MRSA, PCR: NEGATIVE
Staphylococcus aureus: NEGATIVE

## 2015-04-10 NOTE — Progress Notes (Signed)
Pt to see Dr Alvan Dame this afternoon 04/10/2015. Pt aware to receive clarification if need to discontinue any medications prior to surgical date.

## 2015-04-10 NOTE — Patient Instructions (Addendum)
Steven Weeks  04/10/2015   Your procedure is scheduled on: Monday April 22, 2015   Report to [facility] Main  Entrance take [place]  elevators to 3rd floor to  [place] at 7:45 AM.  Call this number if you have problems the morning of surgery [phone]   Remember: ONLY 1 PERSON MAY GO WITH YOU TO SHORT STAY TO GET  READY MORNING OF [place].  Do not eat food or drink liquids :After Midnight.     Take these medicines the morning of surgery with A SIP OF WATER: Omeprazole (Prilosec)                               You may not have any metal on your body including hair pins and              piercings  Do not wear jewelry, lotions, powders or colognes, deodorant                          Men may shave face and neck.   Do not bring valuables to the hospital. [place].  Contacts, dentures or bridgework may not be worn into surgery.  Leave suitcase in the car. After surgery it may be brought to your room.                Please read over the following fact sheets you were given:MRSA INFORMATION SHEET; INCENTIVE SPIROMETER; BLOOD TRANSFUSION INFORMATION SHEET _____________________________________________________________________             [facility] - Preparing for Surgery Before surgery, you can play an important role.  Because skin is not sterile, your skin needs to be as free of germs as possible.  You can reduce the number of germs on your skin by washing with CHG (chlorahexidine gluconate) soap before surgery.  CHG is an antiseptic cleaner which kills germs and bonds with the skin to continue killing germs even after washing. Please DO NOT use if you have an allergy to CHG or antibacterial soaps.  If your skin becomes reddened/irritated stop using the CHG and inform your nurse when you arrive at Short Stay. Do not shave (including legs and underarms) for at least 48 hours prior to the first CHG  shower.  You may shave your face/neck. Please follow these instructions carefully:  1.  Shower with CHG Soap the night before surgery and the  morning of Surgery.  2.  If you choose to wash your hair, wash your hair first as usual with your  normal  shampoo.  3.  After you shampoo, rinse your hair and body thoroughly to remove the  shampoo.                           4.  Use CHG as you would any other liquid soap.  You can apply chg directly  to the skin and wash                       Gently with a scrungie or clean washcloth.  5.  Apply the CHG Soap to your body ONLY FROM THE  NECK DOWN.   Do not use on face/ open                           Wound or open sores. Avoid contact with eyes, ears mouth and genitals (private parts).                       Wash face,  Genitals (private parts) with your normal soap.             6.  Wash thoroughly, paying special attention to the area where your surgery  will be performed.  7.  Thoroughly rinse your body with warm water from the neck down.  8.  DO NOT shower/wash with your normal soap after using and rinsing off  the CHG Soap.                9.  Pat yourself dry with a clean towel.            10.  Wear clean pajamas.            11.  Place clean sheets on your bed the night of your first shower and do not  sleep with pets. Day of Surgery : Do not apply any lotions/deodorants the morning of surgery.  Please wear clean clothes to the hospital/surgery center.  FAILURE TO FOLLOW THESE INSTRUCTIONS MAY RESULT IN THE CANCELLATION OF YOUR SURGERY PATIENT SIGNATURE_________________________________  NURSE SIGNATURE__________________________________  ________________________________________________________________________   Steven Weeks  An incentive spirometer is a tool that can help keep your lungs clear and active. This tool measures how well you are filling your lungs with each breath. Taking long deep breaths may help reverse or decrease the chance  of developing breathing (pulmonary) problems (especially infection) following:  A long period of time when you are unable to move or be active. BEFORE THE PROCEDURE   If the spirometer includes an indicator to show your best effort, your nurse or respiratory therapist will set it to a desired goal.  If possible, sit up straight or lean slightly forward. Try not to slouch.  Hold the incentive spirometer in an upright position. INSTRUCTIONS FOR USE   Sit on the edge of your bed if possible, or sit up as far as you can in bed or on a chair.  Hold the incentive spirometer in an upright position.  Breathe out normally.  Place the mouthpiece in your mouth and seal your lips tightly around it.  Breathe in slowly and as deeply as possible, raising the piston or the ball toward the top of the column.  Hold your breath for 3-5 seconds or for as long as possible. Allow the piston or ball to fall to the bottom of the column.  Remove the mouthpiece from your mouth and breathe out normally.  Rest for a few seconds and repeat Steps 1 through 7 at least 10 times every 1-2 hours when you are awake. Take your time and take a few normal breaths between deep breaths.  The spirometer may include an indicator to show your best effort. Use the indicator as a goal to work toward during each repetition.  After each set of 10 deep breaths, practice coughing to be sure your lungs are clear. If you have an incision (the cut made at the time of surgery), support your incision when coughing by placing a pillow or rolled up towels firmly against it. Once you are able  to get out of bed, walk around indoors and cough well. You may stop using the incentive spirometer when instructed by your caregiver.  RISKS AND COMPLICATIONS  Take your time so you do not get dizzy or light-headed.  If you are in pain, you may need to take or ask for pain medication before doing incentive spirometry. It is harder to take a deep  breath if you are having pain. AFTER USE  Rest and breathe slowly and easily.  It can be helpful to keep track of a log of your progress. Your caregiver can provide you with a simple table to help with this. If you are using the spirometer at home, follow these instructions: [place] IF:   You are having difficultly using the spirometer.  You have trouble using the spirometer as often as instructed.  Your pain medication is not giving enough relief while using the spirometer.  You develop fever of 100.5 F (38.1 C) or higher. SEEK IMMEDIATE MEDICAL CARE IF:   You cough up bloody sputum that had not been present before.  You develop fever of 102 F (38.9 C) or greater.  You develop worsening pain at or near the incision site. MAKE SURE YOU:   Understand these instructions.  Will watch your condition.  Will get help right away if you are not doing well or get worse. Document Released: 09/14/2006 Document Revised: 07/27/2011 Document Reviewed: 11/15/2006 Exit[employer] Patient Information 2014 Exit[employer], [place].   ________________________________________________________________________  WHAT IS A BLOOD TRANSFUSION? Blood Transfusion Information  A transfusion is the replacement of blood or some of its parts. Blood is made up of multiple cells which provide different functions.  Red blood cells carry oxygen and are used for blood loss replacement.  White blood cells fight against infection.  Platelets control bleeding.  Plasma helps clot blood.  Other blood products are available for specialized needs, such as hemophilia or other clotting disorders. BEFORE THE TRANSFUSION  Who gives blood for transfusions?   Healthy volunteers who are fully evaluated to make sure their blood is safe. This is blood bank blood. Transfusion therapy is the safest it has ever been in the practice of medicine. Before blood is taken from a donor, a complete history is taken to make sure  that person has no history of diseases nor engages in risky social behavior (examples are intravenous drug use or sexual activity with multiple partners). The donor's travel history is screened to minimize risk of transmitting infections, such as malaria. The donated blood is tested for signs of infectious diseases, such as HIV and hepatitis. The blood is then tested to be sure it is compatible with you in order to minimize the chance of a transfusion reaction. If you or a relative donates blood, this is often done in anticipation of surgery and is not appropriate for emergency situations. It takes many days to process the donated blood. RISKS AND COMPLICATIONS Although transfusion therapy is very safe and saves many lives, the main dangers of transfusion include:   Getting an infectious disease.  Developing a transfusion reaction. This is an allergic reaction to something in the blood you were given. Every precaution is taken to prevent this. The decision to have a blood transfusion has been considered carefully by your caregiver before blood is given. Blood is not given unless the benefits outweigh the risks. AFTER THE TRANSFUSION  Right after receiving a blood transfusion, you will usually feel much better and more energetic. This is especially true  if your red blood cells have gotten low (anemic). The transfusion raises the level of the red blood cells which carry oxygen, and this usually causes an energy increase.  The nurse administering the transfusion will monitor you carefully for complications. HOME CARE INSTRUCTIONS  No special instructions are needed after a transfusion. You may find your energy is better. Speak with your caregiver about any limitations on activity for underlying diseases you may have. SEEK MEDICAL CARE IF:   Your condition is not improving after your transfusion.  You develop redness or irritation at the intravenous (IV) site. SEEK IMMEDIATE MEDICAL CARE IF:  Any of  the following symptoms occur over the next 12 hours:  Shaking chills.  You have a temperature by mouth above 102 F (38.9 C), not controlled by medicine.  Chest, back, or muscle pain.  People around you feel you are not acting correctly or are confused.  Shortness of breath or difficulty breathing.  Dizziness and fainting.  You get a rash or develop hives.  You have a decrease in urine output.  Your urine turns a dark color or changes to pink, red, or brown. Any of the following symptoms occur over the next 10 days:  You have a temperature by mouth above 102 F (38.9 C), not controlled by medicine.  Shortness of breath.  Weakness after normal activity.  The white part of the eye turns yellow (jaundice).  You have a decrease in the amount of urine or are urinating less often.  Your urine turns a dark color or changes to pink, red, or brown. Document Released: 05/01/2000 Document Revised: 07/27/2011 Document Reviewed: 12/19/2007 [facility] Patient Information 2014 [place], [place].  _______________________________________________________________________

## 2015-04-10 NOTE — Progress Notes (Signed)
EKG / epic 08/07/2014 Clearance note per chart per Dr Dagmar Hait 10/25/2014 CXR / epic 08/07/2014

## 2015-04-17 NOTE — H&P (Signed)
TOTAL KNEE ADMISSION H&P  Patient is being admitted for right total knee arthroplasty.  Subjective:  Chief Complaint:     Right knee primary OA / pain  HPI: Steven Weeks, 74 y.o. male, has a history of pain and functional disability in the right knee due to arthritis and has failed non-surgical conservative treatments for greater than 12 weeks to include NSAID's and/or analgesics, corticosteriod injections and activity modification.  Onset of symptoms was gradual, starting >10 years ago with gradually worsening course since that time. The patient noted prior procedures on the knee to include  arthroscopy and menisectomy on the right knee(s).  Patient currently rates pain in the right knee(s) at 6 out of 10 with activity. Patient has worsening of pain with activity and weight bearing, pain that interferes with activities of daily living, pain with passive range of motion, crepitus and joint swelling.  Patient has evidence of periarticular osteophytes and joint space narrowing by imaging studies.  There is no active infection.   Risks, benefits and expectations were discussed with the patient.  Risks including but not limited to the risk of anesthesia, blood clots, nerve damage, blood vessel damage, failure of the prosthesis, infection and up to and including death.  Patient understand the risks, benefits and expectations and wishes to proceed with surgery.   PCP: Tivis Ringer, MD  D/C Plans:      Home with HHPT  Post-op Meds:       No Rx given  Tranexamic Acid:      To be given - IV   Decadron:      Is to be given  FYI:     ASA post-op  Norco post-op    Patient Active Problem List   Diagnosis Date Noted  . Stage T2a Adenocarcinoma of the Prostate with a Gleason's Score of 4+4 and a PSA of 7.65 04/17/2014   Past Medical History  Diagnosis Date  . Diverticulosis   . Allergic rhinitis   . GERD (gastroesophageal reflux disease)   . Hyperlipidemia   . Insomnia   . Multiple lipomas    . Olecranon bursitis   . History of malignant neoplasm of colon     1994--- S/P  HEMICOLECTOMY--  CHEMOTHERAPY-- no recurrence  . Prostate cancer ([place])     Stage T2a,  Gleason 4+4,  PSA 7.645,  vol. 56.82cc--  43y external radiation 07-17-2014 to 08-20-2014  . History of external beam radiation therapy     prostate--  07-17-2014 to 08-20-2014--  43y  . OA (osteoarthritis)   . ED (erectile dysfunction)   . History of hiatal hernia   . Frequency of urination   . Nocturia   . Cataracts, bilateral   . History of ear infections as a child   . Infection of mastoid bone     left ear  . Hearing loss     more per left ear  . History of perforated ear drum     left ear   . Falls   . Shoulder injury     right / secondary to fall   . Colon cancer ([place])   . History of chemotherapy   . Torn meniscus     right knee approx 12 years ago     Past Surgical History  Procedure Laterality Date  . Prostate biopsy    . Knee arthroscopy Right 2003 approx  . Colon surgery  1994  . Vasectomy  1970's  . Radioactive seed implant N/A 09/07/2014  Procedure: RADIOACTIVE SEED IMPLANT;  Surgeon: Kathie Rhodes, MD;  Location: [facility];  Service: Urology;  Laterality: N/A;  . Tonsillectomy    . Pilonidial cyst      No prescriptions prior to admission   No Known Allergies   Social History  Substance Use Topics  . Smoking status: Former Smoker -- 1.50 packs/day for 27 years    Types: Cigarettes    Quit date: 05/18/1981  . Smokeless tobacco: Never Used  . Alcohol Use: Yes     Comment: daily - 2 to 5 beers    Family History  Problem Relation Age of Onset  . Cancer Mother     colon  . Hypertension Mother   . Cancer Father     lung and bladder     Review of Systems  Constitutional: Negative.   HENT: Negative.   Eyes: Negative.   Respiratory: Negative.   Cardiovascular: Negative.   Gastrointestinal: Positive for heartburn.  Genitourinary: Positive for frequency.   Musculoskeletal: Positive for joint pain.  Skin: Negative.   Neurological: Negative.   Endo/Heme/Allergies: Positive for environmental allergies.  Psychiatric/Behavioral: The patient has insomnia.     Objective:  Physical Exam  Constitutional: He is oriented to person, place, and time. He appears well-developed and well-nourished.  HENT:  Head: Normocephalic and atraumatic.  Eyes: Pupils are equal, round, and reactive to light.  Neck: Neck supple. No JVD present. No tracheal deviation present. No thyromegaly present.  Cardiovascular: Normal rate, regular rhythm, normal heart sounds and intact distal pulses.   Respiratory: Effort normal and breath sounds normal. No stridor. No respiratory distress. He has no wheezes.  GI: Soft. There is no tenderness. There is no guarding.  Musculoskeletal:       Right knee: He exhibits decreased range of motion, swelling and bony tenderness. He exhibits no ecchymosis, no deformity, no laceration and no erythema. Tenderness found.  Lymphadenopathy:    He has no cervical adenopathy.  Neurological: He is alert and oriented to person, place, and time.  Skin: Skin is warm and dry.  Psychiatric: He has a normal mood and affect.      Labs:  Estimated body mass index is 28.12 kg/(m^2) as calculated from the following:   Height as of 09/07/14: 5\' 11"  (1.803 m).   Weight as of 10/04/14: 91.4 kg (201 lb 8 oz).   Imaging Review Plain radiographs demonstrate severe degenerative joint disease of the right knee(s).  The bone quality appears to be good for age and reported activity level.  Assessment/Plan:  End stage arthritis, right knee   The patient history, physical examination, clinical judgment of the provider and imaging studies are consistent with end stage degenerative joint disease of the right knee(s) and total knee arthroplasty is deemed medically necessary. The treatment options including medical management, injection therapy arthroscopy and  arthroplasty were discussed at length. The risks and benefits of total knee arthroplasty were presented and reviewed. The risks due to aseptic loosening, infection, stiffness, patella tracking problems, thromboembolic complications and other imponderables were discussed. The patient acknowledged the explanation, agreed to proceed with the plan and consent was signed. Patient is being admitted for inpatient treatment for surgery, pain control, PT, OT, prophylactic antibiotics, VTE prophylaxis, progressive ambulation and ADL's and discharge planning. The patient is planning to be discharged home with home health services.      West Pugh Alisan Dokes   PA-C  04/17/2015, 2:37 PM

## 2015-04-22 ENCOUNTER — Inpatient Hospital Stay (HOSPITAL_COMMUNITY): Payer: Medicare Other | Admitting: Anesthesiology

## 2015-04-22 ENCOUNTER — Encounter (HOSPITAL_COMMUNITY)
Admission: RE | Disposition: A | Discharge status: Home Health | Payer: Self-pay | Source: Ambulatory Visit | Attending: Orthopedic Surgery

## 2015-04-22 ENCOUNTER — Inpatient Hospital Stay (HOSPITAL_COMMUNITY)
Admission: RE | Admit: 2015-04-22 | Discharge: 2015-04-23 | DRG: 470 | Disposition: A | Discharge status: Home Health | Payer: Medicare Other | Source: Ambulatory Visit | Attending: Orthopedic Surgery | Admitting: Orthopedic Surgery

## 2015-04-22 ENCOUNTER — Encounter (HOSPITAL_COMMUNITY): Payer: Self-pay | Admitting: *Deleted

## 2015-04-22 DIAGNOSIS — H9192 Unspecified hearing loss, left ear: Secondary | ICD-10-CM | POA: Diagnosis present

## 2015-04-22 DIAGNOSIS — Z01812 Encounter for preprocedural laboratory examination: Secondary | ICD-10-CM

## 2015-04-22 DIAGNOSIS — M659 Synovitis and tenosynovitis, unspecified: Secondary | ICD-10-CM | POA: Diagnosis present

## 2015-04-22 DIAGNOSIS — Z9049 Acquired absence of other specified parts of digestive tract: Secondary | ICD-10-CM | POA: Diagnosis not present

## 2015-04-22 DIAGNOSIS — Z85038 Personal history of other malignant neoplasm of large intestine: Secondary | ICD-10-CM

## 2015-04-22 DIAGNOSIS — K219 Gastro-esophageal reflux disease without esophagitis: Secondary | ICD-10-CM | POA: Diagnosis present

## 2015-04-22 DIAGNOSIS — Z87891 Personal history of nicotine dependence: Secondary | ICD-10-CM | POA: Diagnosis not present

## 2015-04-22 DIAGNOSIS — Z8546 Personal history of malignant neoplasm of prostate: Secondary | ICD-10-CM

## 2015-04-22 DIAGNOSIS — M25561 Pain in right knee: Secondary | ICD-10-CM | POA: Diagnosis present

## 2015-04-22 DIAGNOSIS — Z79899 Other long term (current) drug therapy: Secondary | ICD-10-CM

## 2015-04-22 DIAGNOSIS — M1711 Unilateral primary osteoarthritis, right knee: Secondary | ICD-10-CM | POA: Diagnosis present

## 2015-04-22 DIAGNOSIS — Z96651 Presence of right artificial knee joint: Secondary | ICD-10-CM

## 2015-04-22 DIAGNOSIS — Z96659 Presence of unspecified artificial knee joint: Secondary | ICD-10-CM

## 2015-04-22 HISTORY — PX: TOTAL KNEE ARTHROPLASTY: SHX125

## 2015-04-22 SURGERY — ARTHROPLASTY, KNEE, TOTAL
Anesthesia: Spinal | Site: Knee | Laterality: Right

## 2015-04-22 MED ORDER — PHENYLEPHRINE 40 MCG/ML (10ML) SYRINGE FOR IV PUSH (FOR BLOOD PRESSURE SUPPORT)
PREFILLED_SYRINGE | INTRAVENOUS | Status: AC
  Filled 2015-04-22: qty 10

## 2015-04-22 MED ORDER — FENTANYL CITRATE (PF) 100 MCG/2ML IJ SOLN
INTRAMUSCULAR | Status: DC | PRN
  Administered 2015-04-22: 100 ug via INTRAVENOUS

## 2015-04-22 MED ORDER — MIDAZOLAM HCL 2 MG/2ML IJ SOLN
INTRAMUSCULAR | Status: AC
  Filled 2015-04-22: qty 2

## 2015-04-22 MED ORDER — CEFAZOLIN SODIUM-DEXTROSE 2-3 GM-% IV SOLR
2.0000 g | Freq: Four times a day (QID) | INTRAVENOUS | Status: AC
  Administered 2015-04-22 (×2): 2 g via INTRAVENOUS
  Filled 2015-04-22 (×2): qty 50

## 2015-04-22 MED ORDER — DEXAMETHASONE SODIUM PHOSPHATE 10 MG/ML IJ SOLN
INTRAMUSCULAR | Status: AC
Start: 2015-04-22 — End: 2015-04-22
  Filled 2015-04-22: qty 1

## 2015-04-22 MED ORDER — MENTHOL 3 MG MT LOZG: 1.0000 | LOZENGE | OROMUCOSAL | Status: DC | PRN

## 2015-04-22 MED ORDER — KETOROLAC TROMETHAMINE 30 MG/ML IJ SOLN
INTRAMUSCULAR | Status: DC | PRN
  Administered 2015-04-22: 30 mg

## 2015-04-22 MED ORDER — POLYETHYLENE GLYCOL 3350 17 G PO PACK
17.0000 g | PACK | Freq: Two times a day (BID) | ORAL | Status: DC
  Administered 2015-04-22 – 2015-04-23 (×2): 17 g via ORAL

## 2015-04-22 MED ORDER — FERROUS SULFATE 325 (65 FE) MG PO TABS
325.0000 mg | ORAL_TABLET | Freq: Three times a day (TID) | ORAL | Status: DC
  Administered 2015-04-22 – 2015-04-23 (×3): 325 mg via ORAL
  Filled 2015-04-22 (×6): qty 1

## 2015-04-22 MED ORDER — TRANEXAMIC ACID 1000 MG/10ML IV SOLN
1000.0000 mg | Freq: Once | INTRAVENOUS | Status: AC
  Administered 2015-04-22: 1000 mg via INTRAVENOUS
  Filled 2015-04-22: qty 10

## 2015-04-22 MED ORDER — PROPOFOL 10 MG/ML IV BOLUS
INTRAVENOUS | Status: AC
  Filled 2015-04-22: qty 20

## 2015-04-22 MED ORDER — METOCLOPRAMIDE HCL 10 MG PO TABS: 5.0000 mg | ORAL_TABLET | Freq: Three times a day (TID) | ORAL | Status: DC | PRN

## 2015-04-22 MED ORDER — DOCUSATE SODIUM 100 MG PO CAPS
100.0000 mg | ORAL_CAPSULE | Freq: Two times a day (BID) | ORAL | Status: DC
  Administered 2015-04-22 – 2015-04-23 (×2): 100 mg via ORAL

## 2015-04-22 MED ORDER — SODIUM CHLORIDE 0.9 % IV SOLN
INTRAVENOUS | Status: DC
  Administered 2015-04-22: 14:00:00 via INTRAVENOUS
  Filled 2015-04-22 (×6): qty 1000

## 2015-04-22 MED ORDER — BUPIVACAINE-EPINEPHRINE (PF) 0.25% -1:200000 IJ SOLN
INTRAMUSCULAR | Status: AC
  Filled 2015-04-22: qty 30

## 2015-04-22 MED ORDER — METOCLOPRAMIDE HCL 5 MG/ML IJ SOLN: 5.0000 mg | Freq: Three times a day (TID) | INTRAMUSCULAR | Status: DC | PRN

## 2015-04-22 MED ORDER — LACTATED RINGERS IV SOLN: INTRAVENOUS | Status: DC

## 2015-04-22 MED ORDER — HYDROMORPHONE HCL 1 MG/ML IJ SOLN: 0.2500 mg | INTRAMUSCULAR | Status: DC | PRN

## 2015-04-22 MED ORDER — PHENOL 1.4 % MT LIQD
1.0000 | OROMUCOSAL | Status: DC | PRN
  Filled 2015-04-22: qty 177

## 2015-04-22 MED ORDER — PHENYLEPHRINE HCL 10 MG/ML IJ SOLN
INTRAMUSCULAR | Status: DC | PRN
  Administered 2015-04-22: 40 ug via INTRAVENOUS
  Administered 2015-04-22 (×3): 80 ug via INTRAVENOUS

## 2015-04-22 MED ORDER — LACTATED RINGERS IV SOLN
INTRAVENOUS | Status: DC
  Administered 2015-04-22: 10:00:00 via INTRAVENOUS
  Administered 2015-04-22: 1000 mL via INTRAVENOUS
  Administered 2015-04-22: 09:00:00 via INTRAVENOUS

## 2015-04-22 MED ORDER — FENTANYL CITRATE (PF) 100 MCG/2ML IJ SOLN
INTRAMUSCULAR | Status: AC
  Filled 2015-04-22: qty 2

## 2015-04-22 MED ORDER — ONDANSETRON HCL 4 MG/2ML IJ SOLN
INTRAMUSCULAR | Status: DC | PRN
  Administered 2015-04-22: 4 mg via INTRAVENOUS

## 2015-04-22 MED ORDER — CEFAZOLIN SODIUM-DEXTROSE 2-3 GM-% IV SOLR
INTRAVENOUS | Status: AC
Start: 2015-04-22 — End: 2015-04-22
  Filled 2015-04-22: qty 50

## 2015-04-22 MED ORDER — CEFAZOLIN SODIUM-DEXTROSE 2-3 GM-% IV SOLR
2.0000 g | INTRAVENOUS | Status: AC
  Administered 2015-04-22: 2 g via INTRAVENOUS

## 2015-04-22 MED ORDER — MAGNESIUM CITRATE PO SOLN: 1.0000 | Freq: Once | ORAL | Status: DC | PRN

## 2015-04-22 MED ORDER — PROPOFOL 10 MG/ML IV BOLUS
INTRAVENOUS | Status: AC
  Filled 2015-04-22: qty 40

## 2015-04-22 MED ORDER — HYDROMORPHONE HCL 1 MG/ML IJ SOLN
0.5000 mg | INTRAMUSCULAR | Status: DC | PRN
  Administered 2015-04-22: 0.5 mg via INTRAVENOUS
  Filled 2015-04-22: qty 1

## 2015-04-22 MED ORDER — PROPOFOL 500 MG/50ML IV EMUL
INTRAVENOUS | Status: DC | PRN
  Administered 2015-04-22: 150 ug/kg/min via INTRAVENOUS

## 2015-04-22 MED ORDER — TEMAZEPAM 15 MG PO CAPS: 15.0000 mg | ORAL_CAPSULE | Freq: Every evening | ORAL | Status: DC | PRN

## 2015-04-22 MED ORDER — SODIUM CHLORIDE 0.9 % IJ SOLN
INTRAMUSCULAR | Status: DC | PRN
  Administered 2015-04-22: 30 mL

## 2015-04-22 MED ORDER — HYDROCODONE-ACETAMINOPHEN 7.5-325 MG PO TABS
1.0000 | ORAL_TABLET | ORAL | Status: DC
  Administered 2015-04-22: 2 via ORAL
  Administered 2015-04-22: 1 via ORAL
  Administered 2015-04-22 – 2015-04-23 (×5): 2 via ORAL
  Filled 2015-04-22: qty 1
  Filled 2015-04-22 (×6): qty 2

## 2015-04-22 MED ORDER — KETOROLAC TROMETHAMINE 30 MG/ML IJ SOLN
INTRAMUSCULAR | Status: AC
Start: 2015-04-22 — End: 2015-04-22
  Filled 2015-04-22: qty 1

## 2015-04-22 MED ORDER — DEXAMETHASONE SODIUM PHOSPHATE 10 MG/ML IJ SOLN
10.0000 mg | Freq: Once | INTRAMUSCULAR | Status: AC
  Administered 2015-04-23: 10 mg via INTRAVENOUS
  Filled 2015-04-22: qty 1

## 2015-04-22 MED ORDER — NON FORMULARY: 20.0000 mg | Freq: Every day | Status: DC

## 2015-04-22 MED ORDER — SODIUM CHLORIDE 0.9 % IJ SOLN
INTRAMUSCULAR | Status: AC
Start: 2015-04-22 — End: 2015-04-22
  Filled 2015-04-22: qty 50

## 2015-04-22 MED ORDER — METHOCARBAMOL 1000 MG/10ML IJ SOLN
500.0000 mg | Freq: Four times a day (QID) | INTRAVENOUS | Status: DC | PRN
  Administered 2015-04-22: 500 mg via INTRAVENOUS
  Filled 2015-04-22 (×2): qty 5

## 2015-04-22 MED ORDER — ONDANSETRON HCL 4 MG/2ML IJ SOLN: 4.0000 mg | Freq: Four times a day (QID) | INTRAMUSCULAR | Status: DC | PRN

## 2015-04-22 MED ORDER — CHLORHEXIDINE GLUCONATE 4 % EX LIQD: 60.0000 mL | Freq: Once | CUTANEOUS | Status: DC

## 2015-04-22 MED ORDER — ONDANSETRON HCL 4 MG PO TABS: 4.0000 mg | ORAL_TABLET | Freq: Four times a day (QID) | ORAL | Status: DC | PRN

## 2015-04-22 MED ORDER — BUPIVACAINE-EPINEPHRINE (PF) 0.25% -1:200000 IJ SOLN
INTRAMUSCULAR | Status: DC | PRN
  Administered 2015-04-22: 30 mL

## 2015-04-22 MED ORDER — ASPIRIN EC 325 MG PO TBEC
325.0000 mg | DELAYED_RELEASE_TABLET | Freq: Two times a day (BID) | ORAL | Status: DC
  Administered 2015-04-23: 325 mg via ORAL
  Filled 2015-04-22 (×3): qty 1

## 2015-04-22 MED ORDER — PROPOFOL 10 MG/ML IV BOLUS
INTRAVENOUS | Status: DC | PRN
  Administered 2015-04-22 (×3): 30 mg via INTRAVENOUS

## 2015-04-22 MED ORDER — DEXAMETHASONE SODIUM PHOSPHATE 10 MG/ML IJ SOLN
10.0000 mg | Freq: Once | INTRAMUSCULAR | Status: AC
  Administered 2015-04-22: 10 mg via INTRAVENOUS

## 2015-04-22 MED ORDER — METHOCARBAMOL 500 MG PO TABS: 500.0000 mg | ORAL_TABLET | Freq: Four times a day (QID) | ORAL | Status: DC | PRN

## 2015-04-22 MED ORDER — BISACODYL 10 MG RE SUPP: 10.0000 mg | Freq: Every day | RECTAL | Status: DC | PRN

## 2015-04-22 MED ORDER — OMEPRAZOLE 20 MG PO CPDR
20.0000 mg | DELAYED_RELEASE_CAPSULE | Freq: Every day | ORAL | Status: DC
  Administered 2015-04-23: 20 mg via ORAL
  Filled 2015-04-22 (×2): qty 1

## 2015-04-22 MED ORDER — ONDANSETRON HCL 4 MG/2ML IJ SOLN
INTRAMUSCULAR | Status: AC
Start: 2015-04-22 — End: 2015-04-22
  Filled 2015-04-22: qty 2

## 2015-04-22 MED ORDER — DIPHENHYDRAMINE HCL 25 MG PO CAPS: 25.0000 mg | ORAL_CAPSULE | Freq: Four times a day (QID) | ORAL | Status: DC | PRN

## 2015-04-22 MED ORDER — ALUM & MAG HYDROXIDE-SIMETH 200-200-20 MG/5ML PO SUSP: 30.0000 mL | ORAL | Status: DC | PRN

## 2015-04-22 MED ORDER — MIDAZOLAM HCL 5 MG/5ML IJ SOLN
INTRAMUSCULAR | Status: DC | PRN
  Administered 2015-04-22: 1 mg via INTRAVENOUS

## 2015-04-22 MED ORDER — CELECOXIB 200 MG PO CAPS
200.0000 mg | ORAL_CAPSULE | Freq: Two times a day (BID) | ORAL | Status: DC
  Administered 2015-04-22 – 2015-04-23 (×2): 200 mg via ORAL
  Filled 2015-04-22 (×3): qty 1

## 2015-04-22 MED ORDER — BUPIVACAINE IN DEXTROSE 0.75-8.25 % IT SOLN
INTRATHECAL | Status: DC | PRN
  Administered 2015-04-22: 2 mL via INTRATHECAL

## 2015-04-22 SURGICAL SUPPLY — 44 items
BAG DECANTER FOR FLEXI CONT (MISCELLANEOUS) IMPLANT
BAG ZIPLOCK 12X15 (MISCELLANEOUS) IMPLANT
BANDAGE ELASTIC 6 VELCRO ST LF (GAUZE/BANDAGES/DRESSINGS) ×2 IMPLANT
BLADE SAW SGTL 13.0X1.19X90.0M (BLADE) ×2 IMPLANT
BOWL SMART MIX CTS (DISPOSABLE) ×2 IMPLANT
CAPT KNEE TOTAL 3 ATTUNE ×2 IMPLANT
CEMENT HV SMART SET (Cement) ×4 IMPLANT
CLOTH BEACON ORANGE TIMEOUT ST (SAFETY) ×2 IMPLANT
CUFF TOURN SGL QUICK 34 (TOURNIQUET CUFF) ×1
CUFF TRNQT CYL 34X4X40X1 (TOURNIQUET CUFF) ×1 IMPLANT
DECANTER SPIKE VIAL GLASS SM (MISCELLANEOUS) ×2 IMPLANT
DRAPE U-SHAPE 47X51 STRL (DRAPES) ×2 IMPLANT
DRSG AQUACEL AG ADV 3.5X10 (GAUZE/BANDAGES/DRESSINGS) ×2 IMPLANT
DURAPREP 26ML APPLICATOR (WOUND CARE) ×4 IMPLANT
ELECT REM PT RETURN 9FT ADLT (ELECTROSURGICAL) ×2
ELECTRODE REM PT RTRN 9FT ADLT (ELECTROSURGICAL) ×1 IMPLANT
GLOVE BIOGEL M 7.0 STRL (GLOVE) IMPLANT
GLOVE BIOGEL PI IND STRL 7.5 (GLOVE) ×1 IMPLANT
GLOVE BIOGEL PI IND STRL 8.5 (GLOVE) ×1 IMPLANT
GLOVE BIOGEL PI INDICATOR 7.5 (GLOVE) ×1
GLOVE BIOGEL PI INDICATOR 8.5 (GLOVE) ×1
GLOVE ECLIPSE 8.0 STRL XLNG CF (GLOVE) ×2 IMPLANT
GLOVE ORTHO TXT STRL SZ7.5 (GLOVE) ×4 IMPLANT
GOWN STRL REUS W/TWL LRG LVL3 (GOWN DISPOSABLE) ×2 IMPLANT
GOWN STRL REUS W/TWL XL LVL3 (GOWN DISPOSABLE) ×2 IMPLANT
HANDPIECE INTERPULSE COAX TIP (DISPOSABLE) ×1
LIQUID BAND (GAUZE/BANDAGES/DRESSINGS) ×2 IMPLANT
MANIFOLD NEPTUNE II (INSTRUMENTS) ×2 IMPLANT
PACK TOTAL KNEE CUSTOM (KITS) ×2 IMPLANT
POSITIONER SURGICAL ARM (MISCELLANEOUS) ×2 IMPLANT
SET HNDPC FAN SPRY TIP SCT (DISPOSABLE) ×1 IMPLANT
SET PAD KNEE POSITIONER (MISCELLANEOUS) ×2 IMPLANT
SUCTION FRAZIER 12FR DISP (SUCTIONS) ×2 IMPLANT
SUT MNCRL AB 4-0 PS2 18 (SUTURE) ×2 IMPLANT
SUT VIC AB 1 CT1 36 (SUTURE) ×2 IMPLANT
SUT VIC AB 2-0 CT1 27 (SUTURE) ×3
SUT VIC AB 2-0 CT1 TAPERPNT 27 (SUTURE) ×3 IMPLANT
SUT VLOC 180 0 24IN GS25 (SUTURE) ×2 IMPLANT
SYR 50ML LL SCALE MARK (SYRINGE) ×2 IMPLANT
TRAY FOLEY W/METER SILVER 14FR (SET/KITS/TRAYS/PACK) ×2 IMPLANT
TRAY FOLEY W/METER SILVER 16FR (SET/KITS/TRAYS/PACK) ×2 IMPLANT
WATER STERILE IRR 1500ML POUR (IV SOLUTION) ×2 IMPLANT
WRAP KNEE MAXI GEL POST OP (GAUZE/BANDAGES/DRESSINGS) ×2 IMPLANT
YANKAUER SUCT BULB TIP 10FT TU (MISCELLANEOUS) ×2 IMPLANT

## 2015-04-22 NOTE — Discharge Instructions (Signed)

## 2015-04-22 NOTE — Anesthesia Procedure Notes (Signed)
Spinal Patient location during procedure: OR End time: 04/22/2015 9:43 AM Staffing Resident/CRNA: Enrigue Catena E Performed by: anesthesiologist  Preanesthetic Checklist Completed: patient identified, site marked, surgical consent, pre-op evaluation, timeout performed, IV checked, risks and benefits discussed and monitors and equipment checked Spinal Block Patient position: sitting Prep: Betadine Patient monitoring: heart rate, continuous pulse ox and blood pressure Location: L3-4 Injection technique: single-shot Needle Needle type: Spinocan  Needle gauge: 22 G Needle length: 9 cm Assessment Sensory level: T4 Additional Notes Expiration date of kit checked and confirmed. Patient tolerated procedure well, without complications.

## 2015-04-22 NOTE — Evaluation (Signed)
Physical Therapy Evaluation Patient Details Name: Steven Weeks MRN: [id number] DOB: 1940/12/17 Today's Date: 04/22/2015   History of Present Illness  RTKA  Clinical Impression  Patient is mobilizing very well, ambulated x 200'. Patient will benefit from PT to address problems listed in the note below to Dc to home, possibly tomorrow.    Follow Up Recommendations Home health PT;Supervision/Assistance - 24 hour    Equipment Recommendations  Rolling walker with 5" wheels    Recommendations for Other Services       Precautions / Restrictions Precautions Precautions: Knee      Mobility  Bed Mobility Overal bed mobility: Needs Assistance Bed Mobility: Supine to Sit;Sit to Supine     Supine to sit: Supervision Sit to supine: Supervision   General bed mobility comments: cues for  technique  Transfers Overall transfer level: Needs assistance Equipment used: Rolling walker (2 wheeled) Transfers: Sit to/from Stand Sit to Stand: Min guard         General transfer comment: cues for hand  and R leg position  Ambulation/Gait Ambulation/Gait assistance: Min assist Ambulation Distance (Feet): 200 Feet Assistive device: Rolling walker (2 wheeled) Gait Pattern/deviations: Step-to pattern;Step-through pattern     General Gait Details: nice smoothe swing through with R leg,  Stairs            Wheelchair Mobility    Modified Rankin (Stroke Patients Only)       Balance                                             Pertinent Vitals/Pain Pain Assessment: 0-10 Pain Score: 5  Pain Descriptors / Indicators: Aching;Discomfort Pain Intervention(s): Patient requesting pain meds-RN notified;Repositioned;Ice applied;Limited activity within patient's tolerance;Monitored during session    [place] expects to be discharged to:: Private residence Living Arrangements: Spouse/significant other Available Help at Discharge: Family Type of  Home: House Home Access: Stairs to enter   Technical brewer of Steps: 1 [place]: Two level;Able to live on main level with bedroom/bathroom Home Equipment: Shower seat - built in      Prior Function Level of Independence: Independent               Hand Dominance        Extremity/Trunk Assessment   Upper Extremity Assessment: Defer to OT evaluation           Lower Extremity Assessment: RLE deficits/detail RLE Deficits / Details: knee flexion to 80, + SLR    Cervical / Trunk Assessment: Normal  Communication   Communication: HOH  Cognition Arousal/Alertness: Awake/alert Behavior During Therapy: WFL for tasks assessed/performed Overall Cognitive Status: Within Functional Limits for tasks assessed                      General Comments      Exercises Total Joint Exercises Quad Sets: AROM;Right;5 reps Heel Slides: AROM;Right;5 reps Straight Leg Raises: AROM;Right;5 reps      Assessment/Plan    PT Assessment Patient needs continued PT services  PT Diagnosis Acute pain;Difficulty walking   PT Problem List Decreased range of motion;Decreased activity tolerance;Decreased knowledge of precautions;Decreased safety awareness;Decreased knowledge of use of DME  PT Treatment Interventions DME instruction;Gait training;Stair training;Therapeutic activities;Therapeutic exercise;Patient/family education   PT Goals (Current goals can be found in the Care Plan section) Acute Rehab PT Goals Patient Stated Goal:  to go home tomorrow PT Goal Formulation: With patient/family Time For Goal Achievement: 04/25/15 Potential to Achieve Goals: Good    Frequency 7X/week   Barriers to discharge        Co-evaluation               End of Session   Activity Tolerance: Patient tolerated treatment well Patient left: in bed;with call bell/phone within reach;with bed alarm set;with family/visitor present Nurse Communication: Mobility status          Time: 1636-1700 PT Time Calculation (min) (ACUTE ONLY): 24 min   Charges:   PT Evaluation $Initial PT Evaluation Tier I: 1 Procedure PT Treatments $Gait Training: 8-22 mins   PT G CodesClaretha Cooper 04/22/2015, 6:32 PM Tresa Endo PT [phone]

## 2015-04-22 NOTE — Transfer of Care (Signed)
Immediate Anesthesia Transfer of Care Note  Patient: Steven Weeks  Procedure(s) Performed: Procedure(s): RIGHT TOTAL KNEE ARTHROPLASTY (Right)  Patient Location: PACU  Anesthesia Type:Spinal  Level of Consciousness: awake, alert , oriented and patient cooperative  Airway & Oxygen Therapy: Patient Spontanous Breathing and Patient connected to face mask oxygen  Post-op Assessment: Report given to RN and Post -op Vital signs reviewed and stable  Post vital signs: stable  Last Vitals:  Filed Vitals:   04/22/15 0742  BP: 160/77  Pulse: 77  Temp: 36.6 C  Resp: 18    Complications: No apparent anesthesia complications  T 12 spinal level

## 2015-04-22 NOTE — Anesthesia Postprocedure Evaluation (Signed)
Anesthesia Post Note  Patient: [name]  Procedure(s) Performed: Procedure(s) (LRB): RIGHT TOTAL KNEE ARTHROPLASTY (Right)  Patient location during evaluation: PACU Anesthesia Type: Spinal Level of consciousness: awake and alert Pain management: pain level controlled Vital Signs Assessment: post-procedure vital signs reviewed and stable Respiratory status: spontaneous breathing, nonlabored ventilation, respiratory function stable and patient connected to nasal cannula oxygen Cardiovascular status: blood pressure returned to baseline and stable Postop Assessment: no signs of nausea or vomiting Anesthetic complications: no    Last Vitals:  Filed Vitals:   04/22/15 1230 04/22/15 1330  BP: 137/74 130/68  Pulse: 60 65  Temp: 35.7 C 36.4 C  Resp: 16 15    Last Pain:  Filed Vitals:   04/22/15 1345  PainSc: 1                  Elham Fini L

## 2015-04-22 NOTE — Anesthesia Preprocedure Evaluation (Signed)
Anesthesia Evaluation  Patient identified by MRN, date of birth, ID band Patient awake    Reviewed: Allergy & Precautions, H&P , NPO status , Patient's Chart, lab work & pertinent test results  Airway Mallampati: II  TM Distance: >3 FB Neck ROM: Full    Dental no notable dental hx. (+) Dental Advisory Given, Teeth Intact   Pulmonary neg pulmonary ROS, former smoker,    Pulmonary exam normal breath sounds clear to auscultation       Cardiovascular Exercise Tolerance: Good negative cardio ROS Normal cardiovascular exam Rhythm:Regular Rate:Normal     Neuro/Psych negative neurological ROS  negative psych ROS   GI/Hepatic Neg liver ROS, hiatal hernia, GERD  Medicated and Controlled,Colon Ca   Endo/Other  negative endocrine ROS  Renal/GU negative Renal ROS  negative genitourinary   Musculoskeletal negative musculoskeletal ROS (+)   Abdominal   Peds negative pediatric ROS (+)  Hematology negative hematology ROS (+)   Anesthesia Other Findings Prostate Ca  Reproductive/Obstetrics negative OB ROS                             Anesthesia Physical Anesthesia Plan  ASA: III  Anesthesia Plan: Spinal   Post-op Pain Management:    Induction:   Airway Management Planned: Simple Face Mask  Additional Equipment:   Intra-op Plan:   Post-operative Plan:   Informed Consent: I have reviewed the patients History and Physical, chart, labs and discussed the procedure including the risks, benefits and alternatives for the proposed anesthesia with the patient or authorized representative who has indicated his/her understanding and acceptance.   Dental advisory given  Plan Discussed with: CRNA and Surgeon  Anesthesia Plan Comments:         Anesthesia Quick Evaluation

## 2015-04-22 NOTE — Progress Notes (Signed)
Utilization review completed.  

## 2015-04-22 NOTE — Op Note (Signed)
NAME:  ENRIKE POLITIS                      MEDICAL RECORD NO.:  [id number]                             FACILITY:  [facility]      PHYSICIAN:  Pietro Cassis. Alvan Dame, M.D.  DATE OF BIRTH:  1940-05-30      DATE OF PROCEDURE:  04/22/2015                                     OPERATIVE REPORT         PREOPERATIVE DIAGNOSIS:  Right knee osteoarthritis.      POSTOPERATIVE DIAGNOSIS:  Right knee osteoarthritis.      FINDINGS:  The patient was noted to have complete loss of cartilage and   bone-on-bone arthritis with associated osteophytes in all three compartments of   the knee with a significant synovitis and associated effusion.      PROCEDURE:  Right total knee replacement.      COMPONENTS USED:  DePuy Attune rotating platform posterior stabilized knee   system, a size 7 femur, 7 tibia, size 8 mm PS AOX insert, and 41 anatomic patellar   button.      SURGEON:  Pietro Cassis. Alvan Dame, M.D.      ASSISTANT:  Danae Orleans, PA-C.      ANESTHESIA:  Spinal.      SPECIMENS:  None.      COMPLICATION:  None.      DRAINS:   None  EBL: <75cc      TOURNIQUET TIME:   22mm @250mmHg  .      The patient was stable to the recovery room.      INDICATION FOR PROCEDURE:  HOA FRAIM is a 74 y.o. male patient of   mine.  The patient had been seen, evaluated, and treated conservatively in the   office with medication, activity modification, and injections.  The patient had   radiographic changes of bone-on-bone arthritis with endplate sclerosis and osteophytes noted.      The patient failed conservative measures including medication, injections, and activity modification, and at this point was ready for more definitive measures.   Based on the radiographic changes and failed conservative measures, the patient   decided to proceed with total knee replacement.  Risks of infection,   DVT, component failure, need for revision surgery, postop course, and   expectations were all   discussed and reviewed.  Consent  was obtained for benefit of pain   relief.      PROCEDURE IN DETAIL:  The patient was brought to the operative theater.   Once adequate anesthesia, preoperative antibiotics, 2 gm of Ancef, 1 gm of Tranexamic Acid, and 10 mg of Decadron administered, the patient was positioned supine with the right thigh tourniquet placed.  The  right lower extremity was prepped and draped in sterile fashion.  A time-   out was performed identifying the patient, planned procedure, and   extremity.      The right lower extremity was placed in the [facility] leg holder.  The leg was   exsanguinated, tourniquet elevated to 250 mmHg.  A midline incision was   made followed by median parapatellar arthrotomy.  Following initial   exposure, attention was first directed  to the patella.  Precut   measurement was noted to be 26 mm.  I resected down to 15 mm and used a   41 patellar button to restore patellar height as well as cover the cut   surface.      The lug holes were drilled and a metal shim was placed to protect the   patella from retractors and saw blades.      At this point, attention was now directed to the femur.  The femoral   canal was opened with a drill, irrigated to try to prevent fat emboli.  An   intramedullary rod was passed at 5 degrees valgus, 9 mm of bone was   resected off the distal femur.  Following this resection, the tibia was   subluxated anteriorly.  Using the extramedullary guide, 2 mm of bone was resected off   the proximal medial tibia.  We confirmed the gap would be   stable medially and laterally with a size 6 mm insert as well as confirmed   the cut was perpendicular in the coronal plane, checking with an alignment rod.      Once this was done, I sized the femur to be a size 7 in the anterior-   posterior dimension, chose a standard component based on medial and   lateral dimension.  The size 7 rotation block was then pinned in   position anterior referenced using the C-clamp to  set rotation.  The   anterior, posterior, and  chamfer cuts were made without difficulty nor   notching making certain that I was along the anterior cortex to help   with flexion gap stability.      The final box cut was made off the lateral aspect of distal femur.      At this point, the tibia was sized to be a size 7, the size 7 tray was   then pinned in position through the medial third of the tubercle,   drilled, and keel punched.  Trial reduction was now carried with a 7 femur,  7 tibia, a size 6 then up to the size 8 mm insert, and the 41 patella botton.  The knee was brought to   extension, full extension with good flexion stability with the patella   tracking through the trochlea without application of pressure. Femoral lug holes were drilled.  Given   all these findings, the trial components removed.  Final components were   opened and cement was mixed.  The knee was irrigated with normal saline   solution and pulse lavage.  The synovial lining was   then injected with 30cc of 0.25% [place]aine with epinephrine and 1 cc of Toradol plus 30cc of NS for a   total of 61 cc.      The knee was irrigated.  Final implants were then cemented onto clean and   dried cut surfaces of bone with the knee brought to extension with a size 8 mm trial insert.      Once the cement had fully cured, the excess cement was removed   throughout the knee.  I confirmed I was satisfied with the range of   motion and stability, and the final size 8 mm PS AOX insert was chosen.  It was   placed into the knee.      The tourniquet had been let down at 35 minutes.  No significant   hemostasis required.  The   extensor mechanism was then  reapproximated using #1 Vicryl and #0 V-lock sutures with the knee   in flexion.  The   remaining wound was closed with 2-0 Vicryl and running 4-0 Monocryl.   The knee was cleaned, dried, dressed sterilely using Dermabond and   Aquacel dressing.  The patient was then   brought  to recovery room in stable condition, tolerating the procedure   well.   Please note that Physician Assistant, Danae Orleans, PA-C, was present for the entirety of the case, and was utilized for pre-operative positioning, peri-operative retractor management, general facilitation of the procedure.  He was also utilized for primary wound closure at the end of the case.              Pietro Cassis Alvan Dame, M.D.    04/22/2015 11:30 AM

## 2015-04-22 NOTE — Interval H&P Note (Signed)
History and Physical Interval Note:  04/22/2015 8:59 AM  Steven Weeks  has presented today for surgery, with the diagnosis of right knee osteoarthritis  The various methods of treatment have been discussed with the patient and family. After consideration of risks, benefits and other options for treatment, the patient has consented to  Procedure(s): RIGHT TOTAL KNEE ARTHROPLASTY (Right) as a surgical intervention .  The patient's history has been reviewed, patient examined, no change in status, stable for surgery.  I have reviewed the patient's chart and labs.  Questions were answered to the patient's satisfaction.     [name]

## 2015-04-23 ENCOUNTER — Encounter (HOSPITAL_COMMUNITY): Payer: Self-pay | Admitting: Orthopedic Surgery

## 2015-04-23 LAB — BASIC METABOLIC PANEL
Anion gap: 6 (ref 5–15)
BUN: 23 mg/dL — AB (ref 6–20)
CO2: 25 mmol/L (ref 22–32)
CREATININE: 1.06 mg/dL (ref 0.61–1.24)
Calcium: 8.6 mg/dL — ABNORMAL LOW (ref 8.9–10.3)
Chloride: 106 mmol/L (ref 101–111)
GFR calc Af Amer: >60 mL/min (ref >60)
GLUCOSE: 130 mg/dL — AB (ref 65–99)
POTASSIUM: 4.4 mmol/L (ref 3.5–5.1)
Sodium: 137 mmol/L (ref 135–145)

## 2015-04-23 LAB — CBC
HCT: 30.8 % — ABNORMAL LOW (ref 39.0–52.0)
Hemoglobin: 10.5 g/dL — ABNORMAL LOW (ref 13.0–17.0)
MCH: 31.8 pg (ref 26.0–34.0)
MCHC: 34.1 g/dL (ref 30.0–36.0)
MCV: 93.3 fL (ref 78.0–100.0)
PLATELETS: 186 10*3/uL (ref 150–400)
RBC: 3.3 MIL/uL — AB (ref 4.22–5.81)
RDW: 12.3 % (ref 11.5–15.5)
WBC: 10.8 10*3/uL — ABNORMAL HIGH (ref 4.0–10.5)

## 2015-04-23 MED ORDER — FERROUS SULFATE 325 (65 FE) MG PO TABS: 325.0000 mg | ORAL_TABLET | Freq: Three times a day (TID) | ORAL | Status: DC

## 2015-04-23 MED ORDER — POLYETHYLENE GLYCOL 3350 17 G PO PACK: 17.0000 g | PACK | Freq: Two times a day (BID) | ORAL | Status: DC

## 2015-04-23 MED ORDER — DOCUSATE SODIUM 100 MG PO CAPS: 100.0000 mg | ORAL_CAPSULE | Freq: Two times a day (BID) | ORAL | Status: DC

## 2015-04-23 MED ORDER — ASPIRIN 325 MG PO TBEC: 325.0000 mg | DELAYED_RELEASE_TABLET | Freq: Two times a day (BID) | ORAL | Status: DC

## 2015-04-23 MED ORDER — HYDROCODONE-ACETAMINOPHEN 7.5-325 MG PO TABS: 1.0000 | ORAL_TABLET | ORAL | Status: DC | PRN

## 2015-04-23 MED ORDER — CYCLOBENZAPRINE HCL 5 MG PO TABS: 5.0000 mg | ORAL_TABLET | Freq: Three times a day (TID) | ORAL | Status: DC | PRN

## 2015-04-23 NOTE — Progress Notes (Signed)
Patient ID: Steven Weeks, male   DOB: 10-22-1940, 74 y.o.   MRN: [id number] Subjective: 1 Day Post-Op Procedure(s) (LRB): RIGHT TOTAL KNEE ARTHROPLASTY (Right)    Patient reports pain as mild.  Doing very well.  No events over night  Objective:   VITALS:   Filed Vitals:   04/23/15 0125 04/23/15 0448  BP: 121/69 120/62  Pulse: 75 63  Temp: 97.9 F (36.6 C) 97.5 F (36.4 C)  Resp: 16 16    Neurovascular intact Incision: dressing C/D/I  LABS  Recent Labs  04/23/15 0458  HGB 10.5*  HCT 30.8*  WBC 10.8*  PLT 186     Recent Labs  04/23/15 0458  NA 137  K 4.4  BUN 23*  CREATININE 1.06  GLUCOSE 130*    No results for input(s): LABPT, INR in the last 72 hours.   Assessment/Plan: 1 Day Post-Op Procedure(s) (LRB): RIGHT TOTAL KNEE ARTHROPLASTY (Right)   Advance diet Up with therapy Discharge home with home health after therapy RTC in 2 weeks

## 2015-04-23 NOTE — Care Management Note (Signed)
Case Management Note  Patient Details  Name: Steven Weeks MRN: [id number] Date of Birth: 1941-04-17  Subjective/Objective:        s/p Right total knee replacement           Action/Plan: Discharge planning, spoke with patient and spouse at bedside. Have chosen [facility] for [facility] PT. Contacted [facility] for referral. Needs RW, declines 3-n-1, contacted [facility] to deliver to room.   Expected Discharge Date:  04/23/15               Expected Discharge Plan:  [place]  In-House Referral:  NA  Discharge planning Services  CM Consult  Post Acute Care Choice:  Home Health, Durable Medical Equipment Choice offered to:  Patient  DME Arranged:  N/A, Walker rolling DME Agency:  [place]:  PT [place]:  [place]  Status of Service:  Completed, signed off  Medicare Important Message Given:    Date Medicare IM Given:    Medicare IM give by:    Date Additional Medicare IM Given:    Additional Medicare Important Message give by:     If discussed at [place] of Stay Meetings, dates discussed:    Additional Comments:  Guadalupe Maple, RN 04/23/2015, 10:58 AM

## 2015-04-23 NOTE — Evaluation (Signed)
Occupational Therapy Evaluation Patient Details Name: Steven Weeks MRN: [id number] DOB: 03-17-1941 Today's Date: 04/23/2015    History of Present Illness RTKA   Clinical Impression   This 74 year old man was admitted for the above surgery. All education was completed.  No further OT is needed at this time    Follow Up Recommendations  No OT follow up    Equipment Recommendations  None recommended by OT    Recommendations for Other Services       Precautions / Restrictions Precautions Precautions: Knee Restrictions Weight Bearing Restrictions: No      Mobility Bed Mobility         Supine to sit: Supervision Sit to supine: Supervision      Transfers   Equipment used: Rolling walker (2 wheeled) Transfers: Sit to/from Stand Sit to Stand: Min guard         General transfer comment: for safety:  cues for UE placement    Balance                                            ADL Overall ADL's : Needs assistance/impaired     Grooming: Supervision/safety;Standing   Upper Body Bathing: Set up;Sitting   Lower Body Bathing: Minimal assistance;Sit to/from stand   Upper Body Dressing : Set up;Sitting   Lower Body Dressing: Minimal assistance;Sit to/from stand   Toilet Transfer: Min guard;Ambulation;Comfort height toilet   Toileting- Clothing Manipulation and Hygiene: Min guard;Sit to/from stand   Tub/ Shower Transfer: Walk-in shower;Min guard;Ambulation     General ADL Comments: pt donned underwear without difficulty:  needs min A for L sock--almost able to reach but strains. Wife can assist him.  Cues for UE placement during bathroom transfers:  pt practiced several times.  Shower handout given. He may need to take walker into shower as bar is not at seat.  Pt verbalizes precautions     Vision     Perception     Praxis      Pertinent Vitals/Pain Pain Assessment: 0-10 Pain Score: 0-No pain     Hand Dominance      Extremity/Trunk Assessment Upper Extremity Assessment Upper Extremity Assessment: Overall WFL for tasks assessed           Communication Communication Communication: HOH   Cognition Arousal/Alertness: Awake/alert Behavior During Therapy: WFL for tasks assessed/performed Overall Cognitive Status: Within Functional Limits for tasks assessed                     General Comments       Exercises       Shoulder Instructions      Home Living Family/patient expects to be discharged to:: Private residence Living Arrangements: Spouse/significant other Available Help at Discharge: Family               Bathroom Shower/Tub: Walk-in Corporate treasurer Toilet: Handicapped height     Home Equipment: Shower seat - built in;Grab bars - tub/shower          Prior Functioning/Environment Level of Independence: Independent             OT Diagnosis: Generalized weakness   OT Problem List:     OT Treatment/Interventions:      OT Goals(Current goals can be found in the care plan section) Acute Rehab OT Goals Patient Stated Goal: home OT  Goal Formulation: All assessment and education complete, DC therapy  OT Frequency:     Barriers to D/C:            Co-evaluation              End of Session    Activity Tolerance: Patient tolerated treatment well Patient left: in bed;with call bell/phone within reach;with bed alarm set   Time: 0823-0859 OT Time Calculation (min): 36 min Charges:  OT General Charges $OT Visit: 1 Procedure OT Evaluation $Initial OT Evaluation Tier I: 1 Procedure OT Treatments $Self Care/Home Management : 8-22 mins G-Codes:    Kataleya Zaugg 04-26-2015, 9:15 AM   Lesle Chris, OTR/L [phone] 04-26-2015

## 2015-04-23 NOTE — Progress Notes (Signed)
Physical Therapy Treatment Patient Details Name: Steven Weeks MRN: [id number] DOB: 05-Oct-1940 Today's Date: 04/23/2015    History of Present Illness RTKA    PT Comments    POD #1. PM session Patient progressing well with mobility; mod I for transfers, supervision for ambulation for safety - pt. Ambulated ~400' in hallway with rolling walker, step throughs and minimal antalgic; performed stair training, 1 step no rails to simulate home environment, repeated 3x per patient request - required min guard for safety, no physical assist or VC after initial directions - provided handout; reviewed HEP and ice pack   Follow Up Recommendations  Home health PT;Supervision/Assistance - 24 hour     Equipment Recommendations  Rolling walker with 5" wheels    Recommendations for Other Services       Precautions / Restrictions Precautions Precautions: Knee Restrictions Weight Bearing Restrictions: No    Mobility  Bed Mobility Overal bed mobility: Modified Independent Bed Mobility: Supine to Sit     Supine to sit: Supervision     General bed mobility comments: OOB in recliner   Transfers Overall transfer level: Modified independent Equipment used: Rolling walker (2 wheeled) Transfers: Sit to/from Stand Sit to Stand: Modified independent (Device/Increase time);Supervision         General transfer comment: for safety  Ambulation/Gait Ambulation/Gait assistance: Supervision Ambulation Distance (Feet): 400 Feet Assistive device: Rolling walker (2 wheeled) Gait Pattern/deviations: WFL(Within Functional Limits);Step-to pattern;Step-through pattern Gait velocity: average   General Gait Details: nice smoothe swing through with R leg,   Stairs Stairs: Yes Stairs assistance: Min guard Stair Management: No rails;Forwards;With walker Number of Stairs: 1 (x3) General stair comments: for safety - no physical assist or VC after initial direction  Wheelchair Mobility     Modified Rankin (Stroke Patients Only)       Balance                                    Cognition Arousal/Alertness: Awake/alert Behavior During Therapy: WFL for tasks assessed/performed Overall Cognitive Status: Within Functional Limits for tasks assessed                      Exercises Total Joint Exercises Ankle Circles/Pumps: AROM;Both;10 reps;Seated Quad Sets: AROM;Right;10 reps;Seated Towel Squeeze: AROM;10 reps;Both;Seated Short Arc Quad: AROM;10 reps;Supine;Seated;Right Heel Slides: AAROM;10 reps;Supine;Right Hip ABduction/ADduction: AROM;Right;10 reps;Supine;Seated Straight Leg Raises: AROM;Right;10 reps;Supine;Seated    General Comments        Pertinent Vitals/Pain Pain Assessment: No/denies pain    Home Living                      Prior Function            PT Goals (current goals can now be found in the care plan section) Acute Rehab PT Goals Time For Goal Achievement: 04/25/15 Potential to Achieve Goals: Good Progress towards PT goals: Progressing toward goals    Frequency  7X/week    PT Plan Current plan remains appropriate    Co-evaluation             End of Session Equipment Utilized During Treatment: Gait belt Activity Tolerance: Patient tolerated treatment well Patient left: in chair;with call bell/phone within reach;with family/visitor present     Time: 0950-1015 PT Time Calculation (min) (ACUTE ONLY): 25 min  Charges:  $Gait Training: 8-22 mins $Therapeutic Exercise: 8-22 mins  G CodesDenna Haggard, SPTA   04/23/2015 1:58 PM   Pager: [phone]   Reviewed and agree with above Rica Koyanagi  PTA [facility]  Acute  Rehab Pager      [phone]

## 2015-04-23 NOTE — Progress Notes (Signed)
Physical Therapy Treatment Patient Details Name: Steven Weeks MRN: [id number] DOB: [birth year] Today's Date: 04/23/2015    History of Present Illness RTKA    PT Comments    Pt. Progressing well with mobility; modified independent with bed mobility and transfers needing only supervisin for safety - no physical assist; ambulated ~240' in hallway with RW and nice step through gait pattern and supervision for safety; performed total knee exercises 10 reps in recliner - able to do SLR 10x - provided handout and education  Notified RN pt. Needs RW for home   Follow Up Recommendations  Home health PT;Supervision/Assistance - 24 hour     Equipment Recommendations  Rolling walker with 5" wheels    Recommendations for Other Services       Precautions / Restrictions Precautions Precautions: Knee Restrictions Weight Bearing Restrictions: No    Mobility  Bed Mobility Overal bed mobility: Modified Independent Bed Mobility: Supine to Sit     Supine to sit: Supervision Sit to supine: Supervision   General bed mobility comments: supervision for safety  Transfers Overall transfer level: Modified independent Equipment used: Rolling walker (2 wheeled) Transfers: Sit to/from Stand Sit to Stand: Modified independent (Device/Increase time);Supervision         General transfer comment: for safety  Ambulation/Gait Ambulation/Gait assistance: Min guard;Supervision Ambulation Distance (Feet): 240 Feet Assistive device: Rolling walker (2 wheeled) Gait Pattern/deviations: WFL(Within Functional Limits);Step-to pattern;Step-through pattern         Stairs            Wheelchair Mobility    Modified Rankin (Stroke Patients Only)       Balance                                    Cognition Arousal/Alertness: Awake/alert Behavior During Therapy: WFL for tasks assessed/performed Overall Cognitive Status: Within Functional Limits for tasks assessed                       Exercises Total Joint Exercises Ankle Circles/Pumps: AROM;Both;10 reps;Seated Quad Sets: AROM;Right;10 reps;Seated Towel Squeeze: AROM;10 reps;Both;Seated Short Arc Quad: AROM;10 reps;Supine;Seated;Right Heel Slides: AAROM;10 reps;Supine;Right Hip ABduction/ADduction: AROM;Right;10 reps;Supine;Seated Straight Leg Raises: AROM;Right;10 reps;Supine;Seated    General Comments        Pertinent Vitals/Pain Pain Assessment: No/denies pain Pain Score: 0-No pain    Home Living Family/patient expects to be discharged to:: Private residence Living Arrangements: Spouse/significant other Available Help at Discharge: Family         Home Equipment: Shower seat - built in;Grab bars - tub/shower      Prior Function Level of Independence: Independent          PT Goals (current goals can now be found in the care plan section) Acute Rehab PT Goals Patient Stated Goal: home Time For Goal Achievement: 04/25/15 Potential to Achieve Goals: Good Progress towards PT goals: Progressing toward goals    Frequency  7X/week    PT Plan Current plan remains appropriate    Co-evaluation             End of Session Equipment Utilized During Treatment: Gait belt Activity Tolerance: Patient tolerated treatment well Patient left: in chair;with call bell/phone within reach;with chair alarm set;with family/visitor present     Time: 0950-1015 PT Time Calculation (min) (ACUTE ONLY): 25 min  Charges:  $Gait Training: 8-22 mins $Therapeutic Exercise: 8-22 mins  G CodesDenna Haggard, SPTA   04/23/2015 10:48 AM   Pager: [phone]   Reviewed and agree with above Rica Koyanagi  PTA [facility]  Acute  Rehab Pager      [phone]

## 2015-04-23 NOTE — Progress Notes (Signed)
Pt to d/c home with [facility]. RW delivered to room before d/c. No other DME needs. AVS reviewed and "My Chart" discussed with pt. Pt capable of verbalizing medications, signs and symptoms of infection, and follow-up appointments. Remains hemodynamically stable. No signs and symptoms of distress. Educated pt to return to ER in the case of SOB, dizziness, or chest pain.

## 2015-05-06 NOTE — Discharge Summary (Signed)
Physician Discharge Summary  Patient ID: Steven Weeks MRN: [id number] DOB/AGE: [birth year] 74 y.o.  Admit date: 04/22/2015 Discharge date: 04/23/2015   Procedures:  Procedure(s) (LRB): RIGHT TOTAL KNEE ARTHROPLASTY (Right)  Attending Physician:  Steven. Paralee Cancel   Admission Diagnoses:   Right knee primary OA / pain  Discharge Diagnoses:  Principal Problem:   S/P right TKA Active Problems:   S/P knee replacement  Past Medical History  Diagnosis Date  . Diverticulosis   . Allergic rhinitis   . GERD (gastroesophageal reflux disease)   . Hyperlipidemia   . Insomnia   . Multiple lipomas   . Olecranon bursitis   . History of malignant neoplasm of colon     1994--- S/P  HEMICOLECTOMY--  CHEMOTHERAPY-- no recurrence  . Prostate cancer ([place])     Stage T2a,  Gleason 4+4,  PSA 7.645,  vol. 56.82cc--  43y external radiation 07-17-2014 to 08-20-2014  . History of external beam radiation therapy     prostate--  07-17-2014 to 08-20-2014--  43y  . OA (osteoarthritis)   . ED (erectile dysfunction)   . History of hiatal hernia   . Frequency of urination   . Nocturia   . Cataracts, bilateral   . History of ear infections as a child   . Infection of mastoid bone     left ear  . Hearing loss     more per left ear  . History of perforated ear drum     left ear   . Falls   . Shoulder injury     right / secondary to fall   . Colon cancer ([place])   . History of chemotherapy   . Torn meniscus     right knee approx 12 years ago     HPI:    Steven Weeks, 74 y.o. male, has a history of pain and functional disability in the right knee due to arthritis and has failed non-surgical conservative treatments for greater than 12 weeks to include NSAID's and/or analgesics, corticosteriod injections and activity modification. Onset of symptoms was gradual, starting >10 years ago with gradually worsening course since that time. The patient noted prior procedures on the knee to include arthroscopy  and menisectomy on the right knee(s). Patient currently rates pain in the right knee(s) at 6 out of 10 with activity. Patient has worsening of pain with activity and weight bearing, pain that interferes with activities of daily living, pain with passive range of motion, crepitus and joint swelling. Patient has evidence of periarticular osteophytes and joint space narrowing by imaging studies. There is no active infection. Risks, benefits and expectations were discussed with the patient. Risks including but not limited to the risk of anesthesia, blood clots, nerve damage, blood vessel damage, failure of the prosthesis, infection and up to and including death. Patient understand the risks, benefits and expectations and wishes to proceed with surgery.   PCP: Steven Ringer, MD   Discharged Condition: good  Hospital Course:  Patient underwent the above stated procedure on 04/22/2015. Patient tolerated the procedure well and brought to the recovery room in good condition and subsequently to the floor.  POD #1 BP: 120/62 ; Pulse: 63 ; Temp: 97.5 F (36.4 C) ; Resp: 16 Patient reports pain as mild. Doing very well. No events over night. Neurovascular intact and incision: dressing C/D/I  LABS  Basename    HGB     10.5  HCT     30.8    Discharge Exam:  General appearance: alert, cooperative and no distress Extremities: Homans sign is negative, no sign of DVT, no edema, redness or tenderness in the calves or thighs and no ulcers, gangrene or trophic changes  Disposition: Home with follow up in 2 weeks   Follow-up Information    Follow up with Steven Pole, MD. Schedule an appointment as soon as possible for a visit in 2 weeks.   Specialty:  Orthopedic Surgery   Contact information:   [address] [place] [address] [phone]       Follow up with [facility].   Why:  physical therapy   Contact information:   [address]  [place] [address] [phone]       Follow up with [facility].   Why:  rolling walker   Contact information:   [address] [place] [address] [phone]       Discharge Instructions    Call MD / Call 911    Complete by:  As directed   If you experience chest pain or shortness of breath, CALL 911 and be transported to the hospital emergency room.  If you develope a fever above 101 F, pus (white drainage) or increased drainage or redness at the wound, or calf pain, call your surgeon's office.     Change dressing    Complete by:  As directed   Maintain surgical dressing until follow up in the clinic. If the edges start to pull up, may reinforce with tape. If the dressing is no longer working, may remove and cover with gauze and tape, but must keep the area dry and clean.  Call with any questions or concerns.     Constipation Prevention    Complete by:  As directed   Drink plenty of fluids.  Prune juice may be helpful.  You may use a stool softener, such as Colace (over the counter) 100 mg twice a day.  Use MiraLax (over the counter) for constipation as needed.     Diet - low sodium heart healthy    Complete by:  As directed      Discharge instructions    Complete by:  As directed   Maintain surgical dressing until follow up in the clinic. If the edges start to pull up, may reinforce with tape. If the dressing is no longer working, may remove and cover with gauze and tape, but must keep the area dry and clean.  [facility]. Call with any questions or concerns.     Increase activity slowly as tolerated    Complete by:  As directed   Weight bearing as tolerated with assist device (walker, cane, etc) as directed, use it as long as suggested by your surgeon or therapist, typically at least 4-6 weeks.     TED hose    Complete by:  As directed   Use stockings (TED hose) for 2 weeks on both leg(s).  You may remove them  at night for sleeping.             Medication List    STOP taking these medications        ibuprofen 800 MG tablet  Commonly known as:  ADVIL,MOTRIN     meloxicam 15 MG tablet  Commonly known as:  MOBIC      TAKE these medications        aspirin 325 MG EC tablet  Take  1 tablet (325 mg total) by mouth 2 (two) times daily.     cyclobenzaprine 5 MG tablet  Commonly known as:  FLEXERIL  Take 1 tablet (5 mg total) by mouth 3 (three) times daily as needed for muscle spasms.     docusate sodium 100 MG capsule  Commonly known as:  COLACE  Take 1 capsule (100 mg total) by mouth 2 (two) times daily.     ferrous sulfate 325 (65 FE) MG tablet  Take 1 tablet (325 mg total) by mouth 3 (three) times daily after meals.     HYDROcodone-acetaminophen 7.5-325 MG tablet  Commonly known as:  NORCO  Take 1-2 tablets by mouth every 4 (four) hours as needed for moderate pain.     Leuprolide Acetate (6 Month) 45 MG injection  Commonly known as:  LUPRON  Inject 45 mg into the muscle every 6 (six) months.     omeprazole 20 MG capsule  Commonly known as:  PRILOSEC  Take 20 mg by mouth every morning.     polyethylene glycol packet  Commonly known as:  MIRALAX / GLYCOLAX  Take 17 g by mouth 2 (two) times daily.     temazepam 15 MG capsule  Commonly known as:  RESTORIL  Take 15 mg by mouth at bedtime as needed for sleep.         Signed: [place]. Latiqua Daloia   PA-C  05/06/2015, 12:14 PM

## 2015-06-25 DIAGNOSIS — C61 Malignant neoplasm of prostate: Secondary | ICD-10-CM | POA: Diagnosis not present

## 2015-07-02 DIAGNOSIS — R351 Nocturia: Secondary | ICD-10-CM | POA: Diagnosis not present

## 2015-07-02 DIAGNOSIS — N401 Enlarged prostate with lower urinary tract symptoms: Secondary | ICD-10-CM | POA: Diagnosis not present

## 2015-07-02 DIAGNOSIS — N402 Nodular prostate without lower urinary tract symptoms: Secondary | ICD-10-CM | POA: Diagnosis not present

## 2015-07-02 DIAGNOSIS — Z0000 Encounter for general adult medical examination without abnormal findings: Secondary | ICD-10-CM | POA: Diagnosis not present

## 2015-07-02 DIAGNOSIS — C61 Malignant neoplasm of prostate: Secondary | ICD-10-CM | POA: Diagnosis not present

## 2015-09-25 DIAGNOSIS — M129 Arthropathy, unspecified: Secondary | ICD-10-CM | POA: Diagnosis not present

## 2015-09-25 DIAGNOSIS — M19011 Primary osteoarthritis, right shoulder: Secondary | ICD-10-CM | POA: Diagnosis not present

## 2015-10-28 ENCOUNTER — Other Ambulatory Visit: Payer: Self-pay | Admitting: Gastroenterology

## 2015-10-28 DIAGNOSIS — Z86010 Personal history of colonic polyps: Secondary | ICD-10-CM | POA: Diagnosis not present

## 2015-10-28 DIAGNOSIS — Z85038 Personal history of other malignant neoplasm of large intestine: Secondary | ICD-10-CM | POA: Diagnosis not present

## 2015-10-28 DIAGNOSIS — D122 Benign neoplasm of ascending colon: Secondary | ICD-10-CM | POA: Diagnosis not present

## 2015-10-28 DIAGNOSIS — D126 Benign neoplasm of colon, unspecified: Secondary | ICD-10-CM | POA: Diagnosis not present

## 2015-10-31 DIAGNOSIS — M9902 Segmental and somatic dysfunction of thoracic region: Secondary | ICD-10-CM | POA: Diagnosis not present

## 2015-10-31 DIAGNOSIS — M5136 Other intervertebral disc degeneration, lumbar region: Secondary | ICD-10-CM | POA: Diagnosis not present

## 2015-10-31 DIAGNOSIS — M9903 Segmental and somatic dysfunction of lumbar region: Secondary | ICD-10-CM | POA: Diagnosis not present

## 2015-10-31 DIAGNOSIS — M9904 Segmental and somatic dysfunction of sacral region: Secondary | ICD-10-CM | POA: Diagnosis not present

## 2015-11-01 DIAGNOSIS — M25551 Pain in right hip: Secondary | ICD-10-CM | POA: Diagnosis not present

## 2015-11-01 DIAGNOSIS — Z6828 Body mass index (BMI) 28.0-28.9, adult: Secondary | ICD-10-CM | POA: Diagnosis not present

## 2015-11-01 DIAGNOSIS — M9903 Segmental and somatic dysfunction of lumbar region: Secondary | ICD-10-CM | POA: Diagnosis not present

## 2015-11-01 DIAGNOSIS — M9902 Segmental and somatic dysfunction of thoracic region: Secondary | ICD-10-CM | POA: Diagnosis not present

## 2015-11-01 DIAGNOSIS — M5136 Other intervertebral disc degeneration, lumbar region: Secondary | ICD-10-CM | POA: Diagnosis not present

## 2015-11-01 DIAGNOSIS — M9904 Segmental and somatic dysfunction of sacral region: Secondary | ICD-10-CM | POA: Diagnosis not present

## 2015-11-07 DIAGNOSIS — M9902 Segmental and somatic dysfunction of thoracic region: Secondary | ICD-10-CM | POA: Diagnosis not present

## 2015-11-07 DIAGNOSIS — M9903 Segmental and somatic dysfunction of lumbar region: Secondary | ICD-10-CM | POA: Diagnosis not present

## 2015-11-07 DIAGNOSIS — M5136 Other intervertebral disc degeneration, lumbar region: Secondary | ICD-10-CM | POA: Diagnosis not present

## 2015-11-07 DIAGNOSIS — M9904 Segmental and somatic dysfunction of sacral region: Secondary | ICD-10-CM | POA: Diagnosis not present

## 2015-11-12 ENCOUNTER — Encounter: Payer: Self-pay | Admitting: Family Medicine

## 2015-11-12 ENCOUNTER — Ambulatory Visit (INDEPENDENT_AMBULATORY_CARE_PROVIDER_SITE_OTHER): Payer: Medicare Other | Admitting: Family Medicine

## 2015-11-12 VITALS — BP 163/90 | HR 80 | Ht 71.0 in | Wt 205.0 lb

## 2015-11-12 DIAGNOSIS — Z125 Encounter for screening for malignant neoplasm of prostate: Secondary | ICD-10-CM | POA: Diagnosis not present

## 2015-11-12 DIAGNOSIS — M25551 Pain in right hip: Secondary | ICD-10-CM | POA: Diagnosis not present

## 2015-11-12 DIAGNOSIS — R7301 Impaired fasting glucose: Secondary | ICD-10-CM | POA: Diagnosis not present

## 2015-11-12 DIAGNOSIS — Z0000 Encounter for general adult medical examination without abnormal findings: Secondary | ICD-10-CM | POA: Diagnosis not present

## 2015-11-12 DIAGNOSIS — E784 Other hyperlipidemia: Secondary | ICD-10-CM | POA: Diagnosis not present

## 2015-11-12 NOTE — Patient Instructions (Signed)
You have a hip external rotator strain (piriformis, other external rotators like the gemelli, obturator externus). Do home exercises 3 sets of 10 once a day - hip side raises (abduction), standing hip rotation. Pick 2-3 of the stretches, hold stretches for 20-30 seconds, repeat 3 times once or twice a day. Heat 15 minutes at a time 3-4 times a day. Aleve 2 tabs twice a day with food OR ibuprofen 600mg  three times a day with food for pain and inflammation as needed. Massage with tennis ball or racquetball can help. Follow up with me in 4-6 weeks. Let me know if you're not improving and want to do physical therapy.

## 2015-11-14 DIAGNOSIS — M25551 Pain in right hip: Secondary | ICD-10-CM | POA: Insufficient documentation

## 2015-11-14 NOTE — Progress Notes (Signed)
PCP: Tivis Ringer, MD  Subjective:   HPI: Patient is a 75 y.o. male here for right hip pain.  Patient reports he started to get pain in right posterior hip 2 weeks ago. Was playing golf when this started to really bother him - difficulty with twisting to hit ball, getting in and out of the golf cart. Felt like going to give out. Pain 4/10, sharp. Previously took prednisone about 2 weeks ago which helped. Did some stretching on his own which also helped. Saw chiropractor. No bowel/bladder dysfunction. No radiation. No numbness or tingling.  Past Medical History  Diagnosis Date  . Diverticulosis   . Allergic rhinitis   . GERD (gastroesophageal reflux disease)   . Hyperlipidemia   . Insomnia   . Multiple lipomas   . Olecranon bursitis   . History of malignant neoplasm of colon     1994--- S/P  HEMICOLECTOMY--  CHEMOTHERAPY-- no recurrence  . Prostate cancer ([place])     Stage T2a,  Gleason 4+4,  PSA 7.645,  vol. 56.82cc--  43y external radiation 07-17-2014 to 08-20-2014  . History of external beam radiation therapy     prostate--  07-17-2014 to 08-20-2014--  43y  . OA (osteoarthritis)   . ED (erectile dysfunction)   . History of hiatal hernia   . Frequency of urination   . Nocturia   . Cataracts, bilateral   . History of ear infections as a child   . Infection of mastoid bone     left ear  . Hearing loss     more per left ear  . History of perforated ear drum     left ear   . Falls   . Shoulder injury     right / secondary to fall   . Colon cancer ([place])   . History of chemotherapy   . Torn meniscus     right knee approx 12 years ago     Current Outpatient Prescriptions on File Prior to Visit  Medication Sig Dispense Refill  . aspirin EC 325 MG EC tablet Take 1 tablet (325 mg total) by mouth 2 (two) times daily. 60 tablet 0  . docusate sodium (COLACE) 100 MG capsule Take 1 capsule (100 mg total) by mouth 2 (two) times daily. 10 capsule 0  . ferrous sulfate 325  (65 FE) MG tablet Take 1 tablet (325 mg total) by mouth 3 (three) times daily after meals.  3  . Leuprolide Acetate, 6 Month, (LUPRON) 45 MG injection Inject 45 mg into the muscle every 6 (six) months.    Marland Kitchen omeprazole (PRILOSEC) 20 MG capsule Take 20 mg by mouth every morning.     . polyethylene glycol (MIRALAX / GLYCOLAX) packet Take 17 g by mouth 2 (two) times daily. 14 each 0  . temazepam (RESTORIL) 15 MG capsule Take 15 mg by mouth at bedtime as needed for sleep.     No current facility-administered medications on file prior to visit.    Past Surgical History  Procedure Laterality Date  . Prostate biopsy    . Knee arthroscopy Right 2003 approx  . Colon surgery  1994  . Vasectomy  1970's  . Radioactive seed implant N/A 09/07/2014    Procedure: RADIOACTIVE SEED IMPLANT;  Surgeon: Kathie Rhodes, MD;  Location[facility];  Service: Urology;  Laterality: N/A;  . Tonsillectomy    . Pilonidial cyst    . Total knee arthroplasty Right 04/22/2015    Procedure: RIGHT TOTAL KNEE ARTHROPLASTY;  Surgeon: Paralee Cancel, MD;  Location: [facility];  Service: Orthopedics;  Laterality: Right;    No Known Allergies  Social History   Social History  . Marital Status: Married    Spouse Name: N/A  . Number of Children: N/A  . Years of Education: N/A   Occupational History  . Not on file.   Social History Main Topics  . Smoking status: Former Smoker -- 1.50 packs/day for 27 years    Types: Cigarettes    Quit date: 05/18/1981  . Smokeless tobacco: Never Used  . Alcohol Use: 0.0 oz/week    0 Standard drinks or equivalent per week     Comment: daily - 2 to 5 beers  . Drug Use: No  . Sexual Activity: Not on file   Other Topics Concern  . Not on file   Social History Narrative    Family History  Problem Relation Age of Onset  . Cancer Mother     colon  . Hypertension Mother   . Cancer Father     lung and bladder    BP 163/90 mmHg  Pulse 80  Ht 5\' 11"  (1.803 m)  Wt 205  lb (92.987 kg)  BMI 28.60 kg/m2  Review of Systems: See HPI above.    Objective:  Physical Exam:  Gen: NAD, comfortable in exam room  Back/Right hip: No gross deformity, scoliosis. TTP right buttock.  No trochanter, back, other tenderness. FROM with mild pain on full ER in buttock area. Strength LEs 5/5 all muscle groups except 4/5 with hip abduction.   2+ MSRs in patellar and achilles tendons, equal bilaterally. Negative SLRs. Sensation intact to light touch bilaterally. Negative logroll bilateral hips Negative fabers and piriformis stretches.    Assessment & Plan:  1. Right hip pain - consistent with hip external rotator strain.  Shown home exercises and stretches to do daily.  Heat, nsaids, massage.  F/u in 4-6 weeks.  Consider physical therapy if not improving.

## 2015-11-14 NOTE — Assessment & Plan Note (Signed)
consistent with hip external rotator strain.  Shown home exercises and stretches to do daily.  Heat, nsaids, massage.  F/u in 4-6 weeks.  Consider physical therapy if not improving.

## 2015-11-18 DIAGNOSIS — Z1389 Encounter for screening for other disorder: Secondary | ICD-10-CM | POA: Diagnosis not present

## 2015-11-18 DIAGNOSIS — G4700 Insomnia, unspecified: Secondary | ICD-10-CM | POA: Diagnosis not present

## 2015-11-18 DIAGNOSIS — C189 Malignant neoplasm of colon, unspecified: Secondary | ICD-10-CM | POA: Diagnosis not present

## 2015-11-18 DIAGNOSIS — K5900 Constipation, unspecified: Secondary | ICD-10-CM | POA: Diagnosis not present

## 2015-11-18 DIAGNOSIS — Z0000 Encounter for general adult medical examination without abnormal findings: Secondary | ICD-10-CM | POA: Diagnosis not present

## 2015-11-18 DIAGNOSIS — M25551 Pain in right hip: Secondary | ICD-10-CM | POA: Diagnosis not present

## 2015-11-18 DIAGNOSIS — M1990 Unspecified osteoarthritis, unspecified site: Secondary | ICD-10-CM | POA: Diagnosis not present

## 2015-11-18 DIAGNOSIS — E784 Other hyperlipidemia: Secondary | ICD-10-CM | POA: Diagnosis not present

## 2015-11-18 DIAGNOSIS — C61 Malignant neoplasm of prostate: Secondary | ICD-10-CM | POA: Diagnosis not present

## 2015-11-18 DIAGNOSIS — R7301 Impaired fasting glucose: Secondary | ICD-10-CM | POA: Diagnosis not present

## 2015-11-21 DIAGNOSIS — M25511 Pain in right shoulder: Secondary | ICD-10-CM | POA: Diagnosis not present

## 2015-11-21 DIAGNOSIS — M19011 Primary osteoarthritis, right shoulder: Secondary | ICD-10-CM | POA: Diagnosis not present

## 2015-12-10 ENCOUNTER — Ambulatory Visit: Payer: Medicare Other | Admitting: Family Medicine

## 2015-12-10 DIAGNOSIS — C61 Malignant neoplasm of prostate: Secondary | ICD-10-CM | POA: Diagnosis not present

## 2015-12-17 DIAGNOSIS — C61 Malignant neoplasm of prostate: Secondary | ICD-10-CM | POA: Diagnosis not present

## 2015-12-17 DIAGNOSIS — N401 Enlarged prostate with lower urinary tract symptoms: Secondary | ICD-10-CM | POA: Diagnosis not present

## 2015-12-17 DIAGNOSIS — R350 Frequency of micturition: Secondary | ICD-10-CM | POA: Diagnosis not present

## 2015-12-25 DIAGNOSIS — M9903 Segmental and somatic dysfunction of lumbar region: Secondary | ICD-10-CM | POA: Diagnosis not present

## 2015-12-25 DIAGNOSIS — M9902 Segmental and somatic dysfunction of thoracic region: Secondary | ICD-10-CM | POA: Diagnosis not present

## 2015-12-25 DIAGNOSIS — M9904 Segmental and somatic dysfunction of sacral region: Secondary | ICD-10-CM | POA: Diagnosis not present

## 2015-12-25 DIAGNOSIS — M5136 Other intervertebral disc degeneration, lumbar region: Secondary | ICD-10-CM | POA: Diagnosis not present

## 2016-01-01 DIAGNOSIS — C61 Malignant neoplasm of prostate: Secondary | ICD-10-CM | POA: Diagnosis not present

## 2016-01-04 ENCOUNTER — Emergency Department (HOSPITAL_COMMUNITY)
Admission: EM | Admit: 2016-01-04 | Discharge: 2016-01-04 | Disposition: A | Discharge status: Home / Self Care | Payer: Medicare Other | Attending: Emergency Medicine | Admitting: Emergency Medicine

## 2016-01-04 ENCOUNTER — Emergency Department (HOSPITAL_COMMUNITY): Payer: Medicare Other

## 2016-01-04 ENCOUNTER — Encounter (HOSPITAL_COMMUNITY): Payer: Self-pay | Admitting: *Deleted

## 2016-01-04 DIAGNOSIS — W01198A Fall on same level from slipping, tripping and stumbling with subsequent striking against other object, initial encounter: Secondary | ICD-10-CM | POA: Insufficient documentation

## 2016-01-04 DIAGNOSIS — S199XXA Unspecified injury of neck, initial encounter: Secondary | ICD-10-CM | POA: Diagnosis not present

## 2016-01-04 DIAGNOSIS — S0990XA Unspecified injury of head, initial encounter: Secondary | ICD-10-CM | POA: Diagnosis not present

## 2016-01-04 DIAGNOSIS — S8001XA Contusion of right knee, initial encounter: Secondary | ICD-10-CM | POA: Diagnosis not present

## 2016-01-04 DIAGNOSIS — Z7982 Long term (current) use of aspirin: Secondary | ICD-10-CM | POA: Diagnosis not present

## 2016-01-04 DIAGNOSIS — S80211A Abrasion, right knee, initial encounter: Secondary | ICD-10-CM | POA: Insufficient documentation

## 2016-01-04 DIAGNOSIS — Z85038 Personal history of other malignant neoplasm of large intestine: Secondary | ICD-10-CM | POA: Insufficient documentation

## 2016-01-04 DIAGNOSIS — Z96651 Presence of right artificial knee joint: Secondary | ICD-10-CM | POA: Diagnosis not present

## 2016-01-04 DIAGNOSIS — M25461 Effusion, right knee: Secondary | ICD-10-CM | POA: Diagnosis not present

## 2016-01-04 DIAGNOSIS — Z23 Encounter for immunization: Secondary | ICD-10-CM | POA: Diagnosis not present

## 2016-01-04 DIAGNOSIS — Z87891 Personal history of nicotine dependence: Secondary | ICD-10-CM | POA: Diagnosis not present

## 2016-01-04 DIAGNOSIS — S01511A Laceration without foreign body of lip, initial encounter: Secondary | ICD-10-CM | POA: Diagnosis not present

## 2016-01-04 DIAGNOSIS — Y999 Unspecified external cause status: Secondary | ICD-10-CM | POA: Diagnosis not present

## 2016-01-04 DIAGNOSIS — W19XXXA Unspecified fall, initial encounter: Secondary | ICD-10-CM

## 2016-01-04 DIAGNOSIS — Z79899 Other long term (current) drug therapy: Secondary | ICD-10-CM | POA: Diagnosis not present

## 2016-01-04 DIAGNOSIS — Z791 Long term (current) use of non-steroidal anti-inflammatories (NSAID): Secondary | ICD-10-CM | POA: Insufficient documentation

## 2016-01-04 DIAGNOSIS — S20211A Contusion of right front wall of thorax, initial encounter: Secondary | ICD-10-CM | POA: Diagnosis not present

## 2016-01-04 DIAGNOSIS — S00511A Abrasion of lip, initial encounter: Secondary | ICD-10-CM | POA: Diagnosis not present

## 2016-01-04 DIAGNOSIS — Z8546 Personal history of malignant neoplasm of prostate: Secondary | ICD-10-CM | POA: Insufficient documentation

## 2016-01-04 DIAGNOSIS — S0993XA Unspecified injury of face, initial encounter: Secondary | ICD-10-CM | POA: Diagnosis not present

## 2016-01-04 DIAGNOSIS — M542 Cervicalgia: Secondary | ICD-10-CM | POA: Diagnosis not present

## 2016-01-04 DIAGNOSIS — R0781 Pleurodynia: Secondary | ICD-10-CM | POA: Diagnosis not present

## 2016-01-04 DIAGNOSIS — Y929 Unspecified place or not applicable: Secondary | ICD-10-CM | POA: Diagnosis not present

## 2016-01-04 DIAGNOSIS — Y939 Activity, unspecified: Secondary | ICD-10-CM | POA: Insufficient documentation

## 2016-01-04 DIAGNOSIS — T07XXXA Unspecified multiple injuries, initial encounter: Secondary | ICD-10-CM

## 2016-01-04 DIAGNOSIS — R51 Headache: Secondary | ICD-10-CM | POA: Diagnosis not present

## 2016-01-04 MED ORDER — HYDROCODONE-ACETAMINOPHEN 5-325 MG PO TABS: 1.0000 | ORAL_TABLET | ORAL | 0 refills | Status: DC | PRN

## 2016-01-04 MED ORDER — IBUPROFEN 800 MG PO TABS: 800.0000 mg | ORAL_TABLET | Freq: Three times a day (TID) | ORAL | 0 refills | Status: DC

## 2016-01-04 MED ORDER — TETANUS-DIPHTH-ACELL PERTUSSIS 5-2.5-18.5 LF-MCG/0.5 IM SUSP
0.5000 mL | Freq: Once | INTRAMUSCULAR | Status: AC
  Administered 2016-01-04: 0.5 mL via INTRAMUSCULAR
  Filled 2016-01-04: qty 0.5

## 2016-01-04 NOTE — ED Provider Notes (Signed)
[facility] DEPT Provider Note   CSN: [id number] Arrival date & time: 01/04/16  [id number]     History   Chief Complaint Chief Complaint  Patient presents with  . Fall    HPI Steven Weeks is a 75 y.o. male.  Patient presents with mechanical fall after tripping over a curb landing forward on his face and right knee. Denies hitting his head or losing consciousness. He has pain to his right knee which has been replaced. Complains of right rib pain is worse with coughing and movement. Denies any chest pain or shortness of breath. Does not take any blood thinners. Denies any preceding dizziness, lightheadedness, chest pain or shortness of breath. Patient complains of abrasions to his face, lip, right knee and right rib pain. Denies any abdominal pain or back pain.   The history is provided by the patient.  Fall  Pertinent negatives include no chest pain, no abdominal pain, no headaches and no shortness of breath.    Past Medical History:  Diagnosis Date  . Allergic rhinitis   . Cataracts, bilateral   . Colon cancer ([place])   . Diverticulosis   . ED (erectile dysfunction)   . Falls   . Frequency of urination   . GERD (gastroesophageal reflux disease)   . Hearing loss    more per left ear  . History of chemotherapy   . History of ear infections as a child   . History of external beam radiation therapy    prostate--  07-17-2014 to 08-20-2014--  43y  . History of hiatal hernia   . History of malignant neoplasm of colon    1994--- S/P  HEMICOLECTOMY--  CHEMOTHERAPY-- no recurrence  . History of perforated ear drum    left ear   . Hyperlipidemia   . Infection of mastoid bone    left ear  . Insomnia   . Multiple lipomas   . Nocturia   . OA (osteoarthritis)   . Olecranon bursitis   . Prostate cancer ([place])    Stage T2a,  Gleason 4+4,  PSA 7.645,  vol. 56.82cc--  43y external radiation 07-17-2014 to 08-20-2014  . Shoulder injury    right / secondary to fall   . Torn meniscus      right knee approx 12 years ago     Patient Active Problem List   Diagnosis Date Noted  . Right hip pain 11/14/2015  . S/P right TKA 04/22/2015  . S/P knee replacement 04/22/2015  . Stage T2a Adenocarcinoma of the Prostate with a Gleason's Score of 4+4 and a PSA of 7.65 04/17/2014    Past Surgical History:  Procedure Laterality Date  . COLON SURGERY  1994  . KNEE ARTHROSCOPY Right 2003 approx  . pilonidial cyst    . PROSTATE BIOPSY    . RADIOACTIVE SEED IMPLANT N/A 09/07/2014   Procedure: RADIOACTIVE SEED IMPLANT;  Surgeon: Kathie Rhodes, MD;  Location: [facility];  Service: Urology;  Laterality: N/A;  . TONSILLECTOMY    . TOTAL KNEE ARTHROPLASTY Right 04/22/2015   Procedure: RIGHT TOTAL KNEE ARTHROPLASTY;  Surgeon: Paralee Cancel, MD;  Location: [facility];  Service: Orthopedics;  Laterality: Right;  Marland Kitchen VASECTOMY  1970's       Home Medications    Prior to Admission medications   Medication Sig Start Date End Date Taking? Authorizing Provider  aspirin EC 325 MG EC tablet Take 1 tablet (325 mg total) by mouth 2 (two) times daily. 04/23/15   Danae Orleans,  PA-C  docusate sodium (COLACE) 100 MG capsule Take 1 capsule (100 mg total) by mouth 2 (two) times daily. 04/23/15   Danae Orleans, PA-C  ferrous sulfate 325 (65 FE) MG tablet Take 1 tablet (325 mg total) by mouth 3 (three) times daily after meals. 04/23/15   Danae Orleans, PA-C  Leuprolide Acetate, 6 Month, (LUPRON) 45 MG injection Inject 45 mg into the muscle every 6 (six) months. 05/22/14   Historical Provider, MD  omeprazole (PRILOSEC) 20 MG capsule Take 20 mg by mouth every morning.     Historical Provider, MD  polyethylene glycol (MIRALAX / GLYCOLAX) packet Take 17 g by mouth 2 (two) times daily. 04/23/15   Danae Orleans, PA-C  tamsulosin (FLOMAX) 0.4 MG CAPS capsule  10/08/15   Historical Provider, MD  temazepam (RESTORIL) 15 MG capsule Take 15 mg by mouth at bedtime as needed for sleep.    Historical Provider, MD     Family History Family History  Problem Relation Age of Onset  . Cancer Mother     colon  . Hypertension Mother   . Cancer Father     lung and bladder    Social History Social History  Substance Use Topics  . Smoking status: Former Smoker    Packs/day: 1.50    Years: 27.00    Types: Cigarettes    Quit date: 05/18/1981  . Smokeless tobacco: Never Used  . Alcohol use 0.0 oz/week     Comment: daily - 2 to 5 beers     Allergies   Review of patient's allergies indicates no known allergies.   Review of Systems Review of Systems  Constitutional: Negative for activity change, appetite change and fever.  Respiratory: Negative for cough, chest tightness and shortness of breath.   Cardiovascular: Negative for chest pain and palpitations.  Gastrointestinal: Negative for abdominal pain, nausea and vomiting.  Genitourinary: Negative for dysuria, hematuria and urgency.  Musculoskeletal: Positive for arthralgias and myalgias. Negative for back pain and neck pain.  Skin: Positive for wound.  Neurological: Negative for dizziness, weakness, light-headedness and headaches.  A complete 10 system review of systems was obtained and all systems are negative except as noted in the HPI and PMH.     Physical Exam Updated Vital Signs BP 148/80 (BP Location: Left Arm)   Pulse 79   Temp 97.5 F (36.4 C) (Oral)   Resp 18   Ht 5\' 11"  (1.803 m)   Wt 200 lb (90.7 kg)   SpO2 97%   BMI 27.89 kg/m   Physical Exam  Constitutional: He is oriented to person, place, and time. He appears well-developed and well-nourished. No distress.  HENT:  Head: Normocephalic and atraumatic.  Mouth/Throat: Oropharynx is clear and moist. No oropharyngeal exudate.  Abrasion to right lower lip as well as abrasions to right upper lip. There is a small laceration of the mucosal surface of the lower lip. Dentition is nontender and intact. TMJ is nontender. No malocclusion, no trismus  Eyes: Conjunctivae and EOM  are normal. Pupils are equal, round, and reactive to light.  Neck: Normal range of motion. Neck supple.  No C spine tenderness  Cardiovascular: Normal rate, regular rhythm, normal heart sounds and intact distal pulses.   No murmur heard. Pulmonary/Chest: Effort normal and breath sounds normal. No respiratory distress. He exhibits tenderness.  Tender to the right anterior lateral ribs no crepitus, no ecchymosis, equal breath sounds  Abdominal: Soft. There is no tenderness. There is no rebound and no guarding.  Musculoskeletal: Normal range of motion. He exhibits no edema or tenderness.  Abrasion to R knee  Neurological: He is alert and oriented to person, place, and time. No cranial nerve deficit. He exhibits normal muscle tone. Coordination normal.  No ataxia on finger to nose bilaterally. No pronator drift. 5/5 strength throughout. CN 2-12 intact.Equal grip strength. Sensation intact.   Skin: Skin is warm.  Psychiatric: He has a normal mood and affect. His behavior is normal.  Nursing note and vitals reviewed.    ED Treatments / Results  Labs (all labs ordered are listed, but only abnormal results are displayed) Labs Reviewed - No data to display  EKG  EKG Interpretation None       Radiology Dg Ribs Unilateral W/chest Right  Result Date: 01/04/2016 CLINICAL DATA:  he stumbled in a narrow space between his car and a curb and fell straight forward, hitting the asphalt with his right knee and face. Pain in mid anterior right rib and right knee, laceration on right knee EXAM: RIGHT RIBS AND CHEST - 3+ VIEW COMPARISON:  08/07/2014 FINDINGS: No acute fracture. Old healed fracture of the anterior right tenth rib. No bone lesion. Lungs are clear.  No pleural effusion or pneumothorax. Heart, mediastinum and hila are unremarkable. IMPRESSION: 1. No acute rib fracture or rib lesion. 2. No acute cardiopulmonary disease. Electronically Signed   By: Lajean Manes M.D.   On: 01/04/2016 10:59    Ct Head Wo Contrast  Result Date: 01/04/2016 CLINICAL DATA:  Fall with head face and neck pain. EXAM: CT HEAD WITHOUT CONTRAST CT MAXILLOFACIAL WITHOUT CONTRAST CT CERVICAL SPINE WITHOUT CONTRAST TECHNIQUE: Multidetector CT imaging of the head, cervical spine, and maxillofacial structures were performed using the standard protocol without intravenous contrast. Multiplanar CT image reconstructions of the cervical spine and maxillofacial structures were also generated. COMPARISON:  None. FINDINGS: CT HEAD FINDINGS The ventricles are normal in size, for this patient's age, and normal in configuration. There are no parenchymal masses or mass effect, no evidence of an infarct, no extra-axial masses or abnormal fluid collections and no intracranial hemorrhage. No skull fracture. CT MAXILLOFACIAL FINDINGS No fractures. Mild ethmoid and minor sphenoid sinus mucosal thickening. Mild polypoid mucosal thickening in the maxillary sinuses. Several of the left mastoid air cells show fluid attenuation likely a chronic effusion. Remaining mastoid air cells and middle ear cavities are clear. Normal globes and orbits. No significant soft tissue contusion. No hematoma. No soft tissue masses or adenopathy. CT CERVICAL SPINE FINDINGS No fracture or spondylolisthesis. Mild loss disc height at C3-C4 and C4-C5 with moderate loss of disc height at C5-C6 and C6-C7. There is facet degenerative change bilaterally most prominent on the left at C4-C5 and on the right at C3-C4. Facet and uncovertebral spurring leads to varying degrees of neural foraminal narrowing, greatest on the right at C5-C6 and C6-C7, moderate in severity. Soft tissues show carotid vascular calcifications but are otherwise unremarkable. Lung apices are clear. IMPRESSION: HEAD CT: No acute intracranial abnormalities. Age related volume loss. No skull fracture. MAXILLOFACIAL CT:  No fracture or acute finding. CERVICAL CT:  No fracture or acute finding. Electronically  Signed   By: Lajean Manes M.D.   On: 01/04/2016 11:12   Ct Cervical Spine Wo Contrast  Result Date: 01/04/2016 CLINICAL DATA:  Fall with head face and neck pain. EXAM: CT HEAD WITHOUT CONTRAST CT MAXILLOFACIAL WITHOUT CONTRAST CT CERVICAL SPINE WITHOUT CONTRAST TECHNIQUE: Multidetector CT imaging of the head, cervical spine, and maxillofacial structures were  performed using the standard protocol without intravenous contrast. Multiplanar CT image reconstructions of the cervical spine and maxillofacial structures were also generated. COMPARISON:  None. FINDINGS: CT HEAD FINDINGS The ventricles are normal in size, for this patient's age, and normal in configuration. There are no parenchymal masses or mass effect, no evidence of an infarct, no extra-axial masses or abnormal fluid collections and no intracranial hemorrhage. No skull fracture. CT MAXILLOFACIAL FINDINGS No fractures. Mild ethmoid and minor sphenoid sinus mucosal thickening. Mild polypoid mucosal thickening in the maxillary sinuses. Several of the left mastoid air cells show fluid attenuation likely a chronic effusion. Remaining mastoid air cells and middle ear cavities are clear. Normal globes and orbits. No significant soft tissue contusion. No hematoma. No soft tissue masses or adenopathy. CT CERVICAL SPINE FINDINGS No fracture or spondylolisthesis. Mild loss disc height at C3-C4 and C4-C5 with moderate loss of disc height at C5-C6 and C6-C7. There is facet degenerative change bilaterally most prominent on the left at C4-C5 and on the right at C3-C4. Facet and uncovertebral spurring leads to varying degrees of neural foraminal narrowing, greatest on the right at C5-C6 and C6-C7, moderate in severity. Soft tissues show carotid vascular calcifications but are otherwise unremarkable. Lung apices are clear. IMPRESSION: HEAD CT: No acute intracranial abnormalities. Age related volume loss. No skull fracture. MAXILLOFACIAL CT:  No fracture or acute  finding. CERVICAL CT:  No fracture or acute finding. Electronically Signed   By: Lajean Manes M.D.   On: 01/04/2016 11:12   Dg Knee Complete 4 Views Right  Result Date: 01/04/2016 CLINICAL DATA:  he stumbled in a narrow space between his car and a curb and fell straight forward, hitting the asphalt with his right knee and face. Pain in mid anterior right rib and right knee, laceration on right knee EXAM: RIGHT KNEE - COMPLETE 4+ VIEW COMPARISON:  None. FINDINGS: No acute fracture. Knee prosthetic components are well-seated and well-aligned. There is a small joint effusion. Soft tissues are unremarkable. IMPRESSION: 1. No acute fracture or dislocation. 2. No evidence of loosening of the orthopedic hardware. 3. Small joint effusion. Electronically Signed   By: Lajean Manes M.D.   On: 01/04/2016 11:01   Ct Maxillofacial Wo Contrast  Result Date: 01/04/2016 CLINICAL DATA:  Fall with head face and neck pain. EXAM: CT HEAD WITHOUT CONTRAST CT MAXILLOFACIAL WITHOUT CONTRAST CT CERVICAL SPINE WITHOUT CONTRAST TECHNIQUE: Multidetector CT imaging of the head, cervical spine, and maxillofacial structures were performed using the standard protocol without intravenous contrast. Multiplanar CT image reconstructions of the cervical spine and maxillofacial structures were also generated. COMPARISON:  None. FINDINGS: CT HEAD FINDINGS The ventricles are normal in size, for this patient's age, and normal in configuration. There are no parenchymal masses or mass effect, no evidence of an infarct, no extra-axial masses or abnormal fluid collections and no intracranial hemorrhage. No skull fracture. CT MAXILLOFACIAL FINDINGS No fractures. Mild ethmoid and minor sphenoid sinus mucosal thickening. Mild polypoid mucosal thickening in the maxillary sinuses. Several of the left mastoid air cells show fluid attenuation likely a chronic effusion. Remaining mastoid air cells and middle ear cavities are clear. Normal globes and orbits.  No significant soft tissue contusion. No hematoma. No soft tissue masses or adenopathy. CT CERVICAL SPINE FINDINGS No fracture or spondylolisthesis. Mild loss disc height at C3-C4 and C4-C5 with moderate loss of disc height at C5-C6 and C6-C7. There is facet degenerative change bilaterally most prominent on the left at C4-C5 and on the right  at C3-C4. Facet and uncovertebral spurring leads to varying degrees of neural foraminal narrowing, greatest on the right at C5-C6 and C6-C7, moderate in severity. Soft tissues show carotid vascular calcifications but are otherwise unremarkable. Lung apices are clear. IMPRESSION: HEAD CT: No acute intracranial abnormalities. Age related volume loss. No skull fracture. MAXILLOFACIAL CT:  No fracture or acute finding. CERVICAL CT:  No fracture or acute finding. Electronically Signed   By: Lajean Manes M.D.   On: 01/04/2016 11:12    Procedures Procedures (including critical care time)  Medications Ordered in ED Medications  Tdap (BOOSTRIX) injection 0.5 mL (not administered)     Initial Impression / Assessment and Plan / ED Course  I have reviewed the triage vital signs and the nursing notes.  Pertinent labs & imaging results that were available during my care of the patient were reviewed by me and considered in my medical decision making (see chart for details).  Clinical Course  Mechanical fall with abrasions to face, right knee and right rib pain. No loss of consciousness. No blood thinner use.  Imaging is negative for acute traumatic pathology. Tetanus is updated. Patient is ambulatory. Denies any difficulty with walking. No rib fracture or pneumothorax.  Patient reassured. Wounds are cleaned. Tetanus is updated. We'll give short course of pain medication and anti-inflammatories for the next several days. Follow up with his PCP. Return precautions discussed.  Final Clinical Impressions(s) / ED Diagnoses   Final diagnoses:  Fall, initial encounter   Multiple contusions    New Prescriptions New Prescriptions   No medications on file     Ezequiel Essex, MD 01/04/16 1545

## 2016-01-04 NOTE — Discharge Instructions (Signed)
Follow-up with your doctor. Use the pain medication as prescribed. Return to the ED if you develop new or worsening symptoms.

## 2016-01-04 NOTE — ED Triage Notes (Signed)
Patient states around 1.5 hours PTA he stumbled in a narrow space between his car and a curb and fell straight forward, hitting the asphalt with his right knee and face.  Patient denies dizziness prior to fall.  Patient also c/o right upper rib pain that is worse with coughing, movement and palpation.  Patient denies chest pain and SOB.  He has an abrasion to right knee (which has been replaced), chin, right upper lip and above right upper lip.  Small abrasion to right elbow.  Patient denies taking blood thinning medications.

## 2016-03-04 DIAGNOSIS — H52223 Regular astigmatism, bilateral: Secondary | ICD-10-CM | POA: Diagnosis not present

## 2016-03-14 DIAGNOSIS — Z23 Encounter for immunization: Secondary | ICD-10-CM | POA: Diagnosis not present

## 2016-05-06 DIAGNOSIS — Z96651 Presence of right artificial knee joint: Secondary | ICD-10-CM | POA: Diagnosis not present

## 2016-05-06 DIAGNOSIS — Z471 Aftercare following joint replacement surgery: Secondary | ICD-10-CM | POA: Diagnosis not present

## 2016-05-13 DIAGNOSIS — M9904 Segmental and somatic dysfunction of sacral region: Secondary | ICD-10-CM | POA: Diagnosis not present

## 2016-05-13 DIAGNOSIS — M5136 Other intervertebral disc degeneration, lumbar region: Secondary | ICD-10-CM | POA: Diagnosis not present

## 2016-05-13 DIAGNOSIS — M9903 Segmental and somatic dysfunction of lumbar region: Secondary | ICD-10-CM | POA: Diagnosis not present

## 2016-05-13 DIAGNOSIS — M9905 Segmental and somatic dysfunction of pelvic region: Secondary | ICD-10-CM | POA: Diagnosis not present

## 2016-06-19 DIAGNOSIS — C61 Malignant neoplasm of prostate: Secondary | ICD-10-CM | POA: Diagnosis not present

## 2016-07-01 DIAGNOSIS — Z8546 Personal history of malignant neoplasm of prostate: Secondary | ICD-10-CM | POA: Diagnosis not present

## 2016-07-01 DIAGNOSIS — N401 Enlarged prostate with lower urinary tract symptoms: Secondary | ICD-10-CM | POA: Diagnosis not present

## 2016-07-01 DIAGNOSIS — R350 Frequency of micturition: Secondary | ICD-10-CM | POA: Diagnosis not present

## 2016-09-29 DIAGNOSIS — D2262 Melanocytic nevi of left upper limb, including shoulder: Secondary | ICD-10-CM | POA: Diagnosis not present

## 2016-09-29 DIAGNOSIS — D225 Melanocytic nevi of trunk: Secondary | ICD-10-CM | POA: Diagnosis not present

## 2016-09-29 DIAGNOSIS — D1801 Hemangioma of skin and subcutaneous tissue: Secondary | ICD-10-CM | POA: Diagnosis not present

## 2016-09-29 DIAGNOSIS — L821 Other seborrheic keratosis: Secondary | ICD-10-CM | POA: Diagnosis not present

## 2016-09-29 DIAGNOSIS — C44519 Basal cell carcinoma of skin of other part of trunk: Secondary | ICD-10-CM | POA: Diagnosis not present

## 2016-09-30 DIAGNOSIS — G8929 Other chronic pain: Secondary | ICD-10-CM | POA: Diagnosis not present

## 2016-09-30 DIAGNOSIS — M25511 Pain in right shoulder: Secondary | ICD-10-CM | POA: Diagnosis not present

## 2016-09-30 DIAGNOSIS — M19011 Primary osteoarthritis, right shoulder: Secondary | ICD-10-CM | POA: Diagnosis not present

## 2016-10-26 DIAGNOSIS — H9193 Unspecified hearing loss, bilateral: Secondary | ICD-10-CM | POA: Diagnosis not present

## 2016-10-26 DIAGNOSIS — H6123 Impacted cerumen, bilateral: Secondary | ICD-10-CM | POA: Diagnosis not present

## 2016-10-26 DIAGNOSIS — J309 Allergic rhinitis, unspecified: Secondary | ICD-10-CM | POA: Diagnosis not present

## 2016-10-27 DIAGNOSIS — M9902 Segmental and somatic dysfunction of thoracic region: Secondary | ICD-10-CM | POA: Diagnosis not present

## 2016-10-27 DIAGNOSIS — M5134 Other intervertebral disc degeneration, thoracic region: Secondary | ICD-10-CM | POA: Diagnosis not present

## 2016-10-27 DIAGNOSIS — M5136 Other intervertebral disc degeneration, lumbar region: Secondary | ICD-10-CM | POA: Diagnosis not present

## 2016-10-27 DIAGNOSIS — M9903 Segmental and somatic dysfunction of lumbar region: Secondary | ICD-10-CM | POA: Diagnosis not present

## 2016-10-29 DIAGNOSIS — M5136 Other intervertebral disc degeneration, lumbar region: Secondary | ICD-10-CM | POA: Diagnosis not present

## 2016-10-29 DIAGNOSIS — M5134 Other intervertebral disc degeneration, thoracic region: Secondary | ICD-10-CM | POA: Diagnosis not present

## 2016-10-29 DIAGNOSIS — M9903 Segmental and somatic dysfunction of lumbar region: Secondary | ICD-10-CM | POA: Diagnosis not present

## 2016-10-29 DIAGNOSIS — M9902 Segmental and somatic dysfunction of thoracic region: Secondary | ICD-10-CM | POA: Diagnosis not present

## 2016-11-28 IMAGING — CR DG HIP (WITH OR WITHOUT PELVIS) 4+V*L*
3 series · 3 of 3 positions shown · non-contrast
Comparison: 05/16/2014 bone scan and CT

CLINICAL DATA: 73-year-old male with increased activity overlying
the left hip on recent whole body bone scan. History of prostate
cancer.

EXAM:
DG HIP W/ PELVIS 4+V*L*

[t pelvis a.p.]
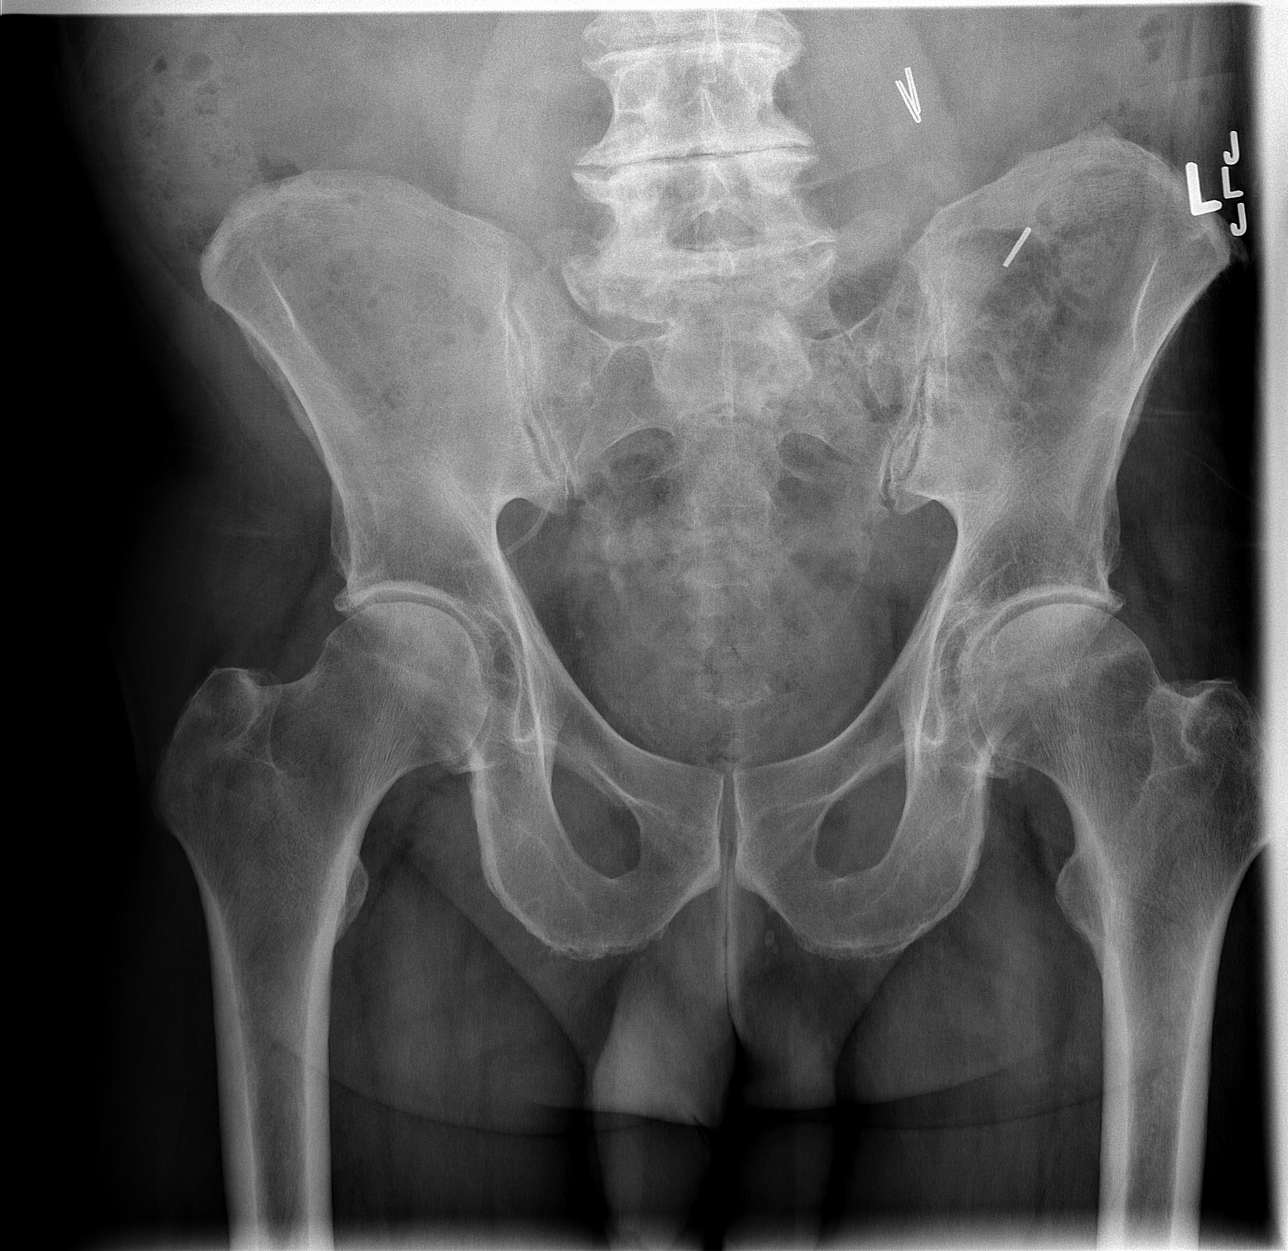

[t hip ap left]
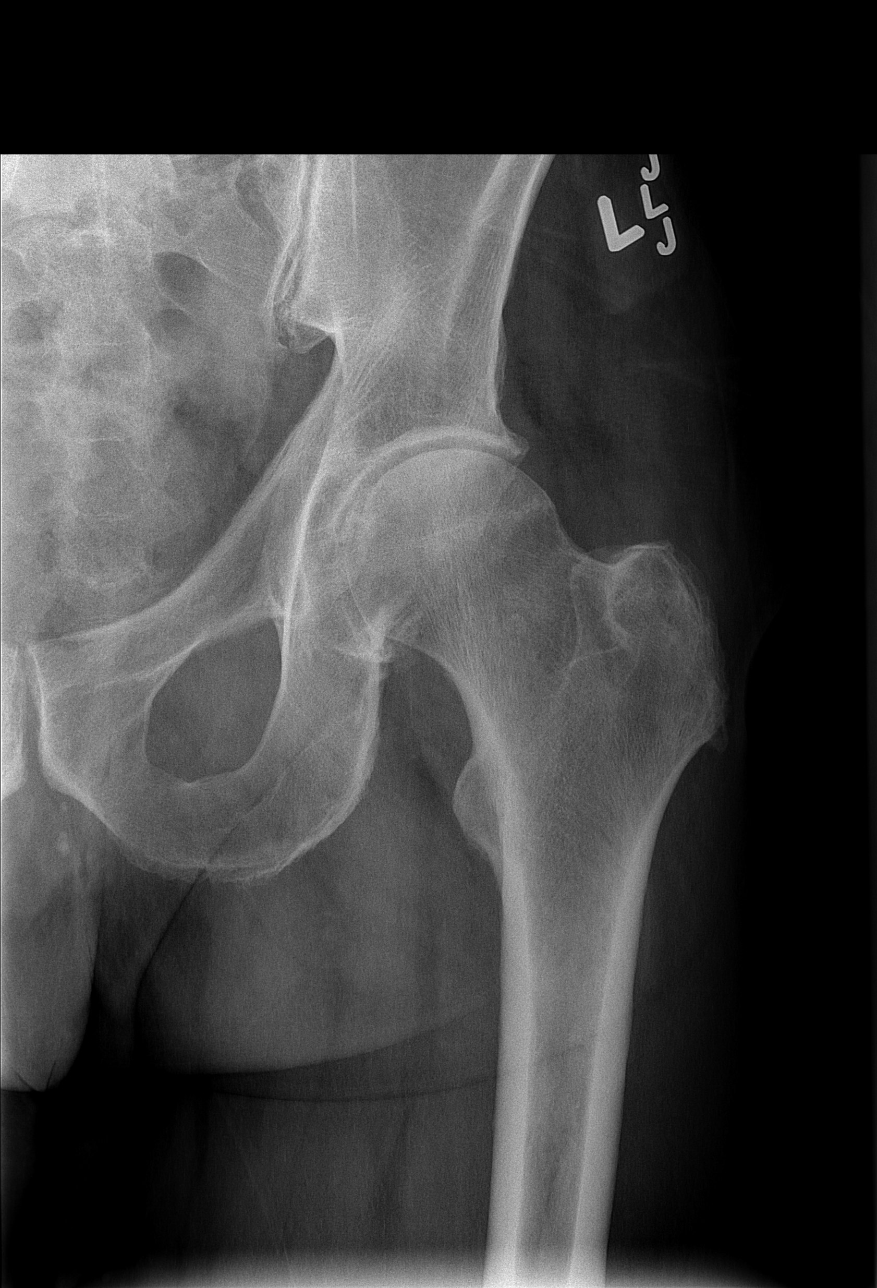

[t hip frog leg left]
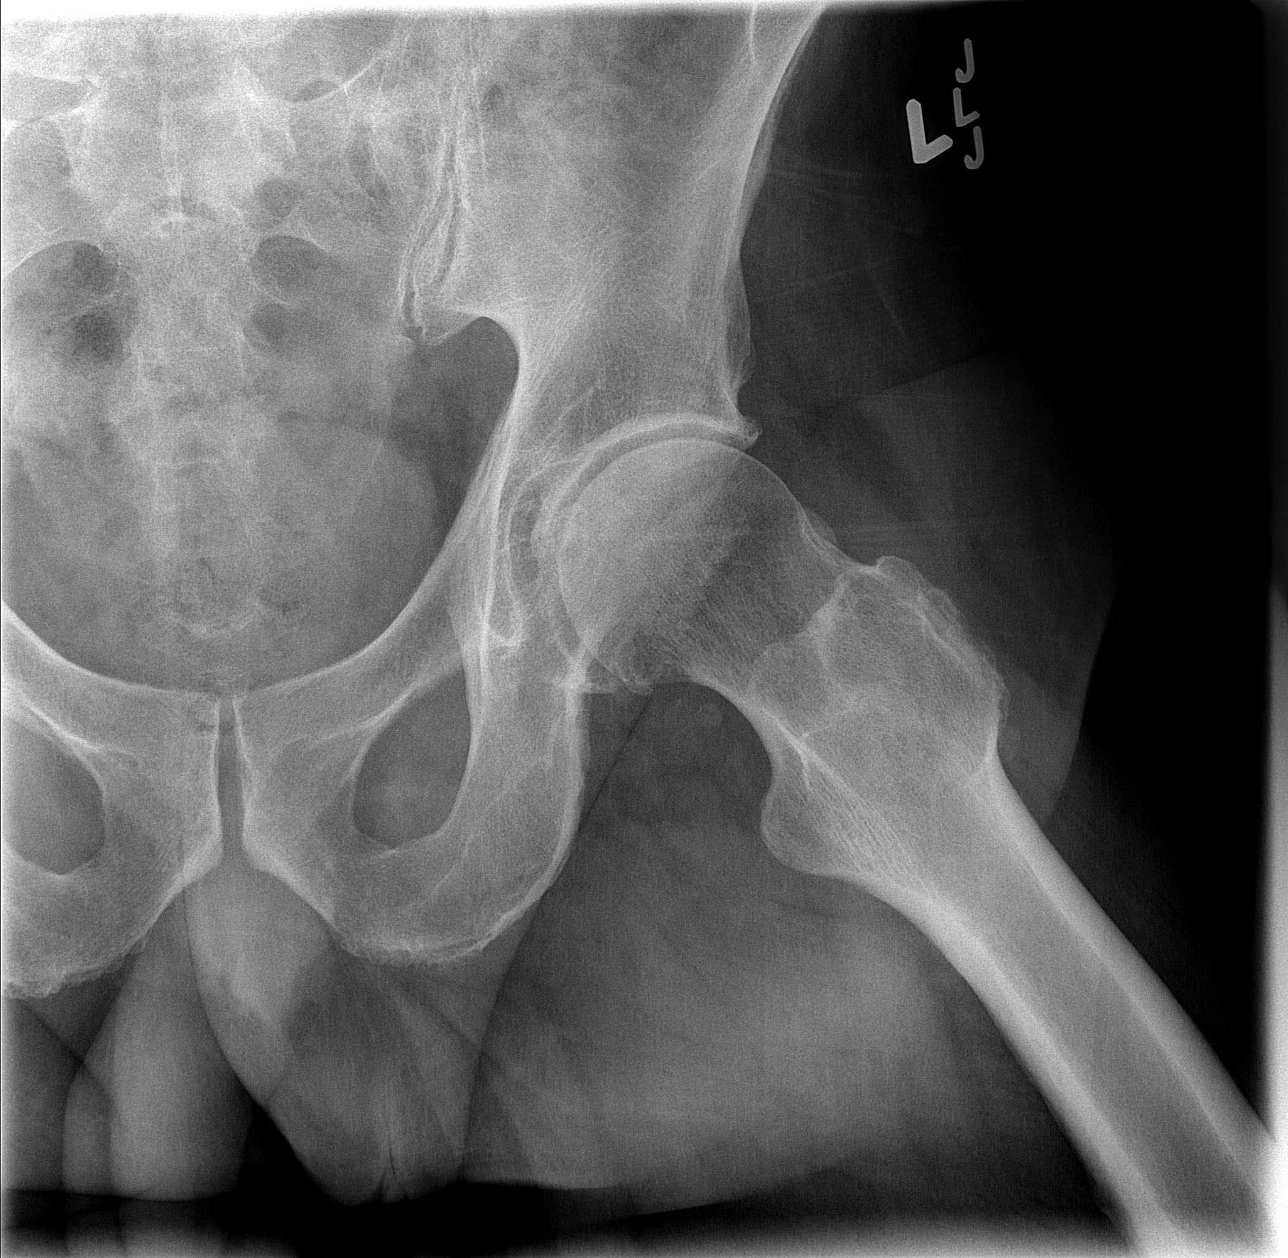

[3 of 3 positions shown; findings below may reference images not displayed]

FINDINGS: Mild to moderate degenerative changes in the left hip per noted
accounting for increased activity on recent bone scan. No focal bony
lesions are identified.

Mild degenerative changes in the right hip are noted.

Degenerative changes in the lower lumbar spine are present.
IMPRESSION: Mild to moderate degenerative changes in the left hip accounting for
increased activity on recent bone scan.

## 2016-12-08 DIAGNOSIS — Z125 Encounter for screening for malignant neoplasm of prostate: Secondary | ICD-10-CM | POA: Diagnosis not present

## 2016-12-08 DIAGNOSIS — E784 Other hyperlipidemia: Secondary | ICD-10-CM | POA: Diagnosis not present

## 2016-12-08 DIAGNOSIS — Z0000 Encounter for general adult medical examination without abnormal findings: Secondary | ICD-10-CM | POA: Diagnosis not present

## 2016-12-08 DIAGNOSIS — R7301 Impaired fasting glucose: Secondary | ICD-10-CM | POA: Diagnosis not present

## 2016-12-10 DIAGNOSIS — C189 Malignant neoplasm of colon, unspecified: Secondary | ICD-10-CM | POA: Diagnosis not present

## 2016-12-10 DIAGNOSIS — Z0000 Encounter for general adult medical examination without abnormal findings: Secondary | ICD-10-CM | POA: Diagnosis not present

## 2016-12-10 DIAGNOSIS — E784 Other hyperlipidemia: Secondary | ICD-10-CM | POA: Diagnosis not present

## 2016-12-10 DIAGNOSIS — R7301 Impaired fasting glucose: Secondary | ICD-10-CM | POA: Diagnosis not present

## 2016-12-11 DIAGNOSIS — Z1212 Encounter for screening for malignant neoplasm of rectum: Secondary | ICD-10-CM | POA: Diagnosis not present

## 2016-12-21 DIAGNOSIS — H6122 Impacted cerumen, left ear: Secondary | ICD-10-CM | POA: Diagnosis not present

## 2016-12-21 DIAGNOSIS — H7292 Unspecified perforation of tympanic membrane, left ear: Secondary | ICD-10-CM | POA: Diagnosis not present

## 2016-12-21 DIAGNOSIS — H906 Mixed conductive and sensorineural hearing loss, bilateral: Secondary | ICD-10-CM | POA: Diagnosis not present

## 2016-12-22 DIAGNOSIS — C61 Malignant neoplasm of prostate: Secondary | ICD-10-CM | POA: Diagnosis not present

## 2016-12-29 DIAGNOSIS — E349 Endocrine disorder, unspecified: Secondary | ICD-10-CM | POA: Diagnosis not present

## 2016-12-29 DIAGNOSIS — Z8546 Personal history of malignant neoplasm of prostate: Secondary | ICD-10-CM | POA: Diagnosis not present

## 2017-02-16 DIAGNOSIS — M25562 Pain in left knee: Secondary | ICD-10-CM | POA: Diagnosis not present

## 2017-02-16 DIAGNOSIS — Z96651 Presence of right artificial knee joint: Secondary | ICD-10-CM | POA: Diagnosis not present

## 2017-02-16 DIAGNOSIS — M25552 Pain in left hip: Secondary | ICD-10-CM | POA: Diagnosis not present

## 2017-02-17 DIAGNOSIS — G8929 Other chronic pain: Secondary | ICD-10-CM | POA: Diagnosis not present

## 2017-02-17 DIAGNOSIS — M25511 Pain in right shoulder: Secondary | ICD-10-CM | POA: Diagnosis not present

## 2017-02-24 DIAGNOSIS — E7849 Other hyperlipidemia: Secondary | ICD-10-CM | POA: Diagnosis not present

## 2017-03-02 ENCOUNTER — Other Ambulatory Visit (HOSPITAL_COMMUNITY): Payer: Self-pay | Admitting: Orthopedic Surgery

## 2017-03-02 DIAGNOSIS — Z96651 Presence of right artificial knee joint: Secondary | ICD-10-CM

## 2017-03-02 DIAGNOSIS — Z8546 Personal history of malignant neoplasm of prostate: Secondary | ICD-10-CM

## 2017-03-02 DIAGNOSIS — M25552 Pain in left hip: Secondary | ICD-10-CM

## 2017-03-02 DIAGNOSIS — Z471 Aftercare following joint replacement surgery: Secondary | ICD-10-CM

## 2017-03-13 DIAGNOSIS — Z23 Encounter for immunization: Secondary | ICD-10-CM | POA: Diagnosis not present

## 2017-03-16 ENCOUNTER — Encounter (HOSPITAL_COMMUNITY)
Admission: RE | Admit: 2017-03-16 | Discharge: 2017-03-16 | Disposition: A | Discharge status: Home / Self Care | Payer: Medicare Other | Source: Ambulatory Visit | Attending: Orthopedic Surgery | Admitting: Orthopedic Surgery

## 2017-03-16 DIAGNOSIS — M25552 Pain in left hip: Secondary | ICD-10-CM

## 2017-03-16 DIAGNOSIS — Z8546 Personal history of malignant neoplasm of prostate: Secondary | ICD-10-CM | POA: Diagnosis present

## 2017-03-16 DIAGNOSIS — C61 Malignant neoplasm of prostate: Secondary | ICD-10-CM | POA: Diagnosis not present

## 2017-03-16 MED ORDER — TECHNETIUM TC 99M MEDRONATE IV KIT
20.0000 | PACK | Freq: Once | INTRAVENOUS | Status: AC | PRN
  Administered 2017-03-16: 20 via INTRAVENOUS

## 2017-04-06 DIAGNOSIS — Z85828 Personal history of other malignant neoplasm of skin: Secondary | ICD-10-CM | POA: Diagnosis not present

## 2017-04-06 DIAGNOSIS — L821 Other seborrheic keratosis: Secondary | ICD-10-CM | POA: Diagnosis not present

## 2017-04-20 DIAGNOSIS — M19011 Primary osteoarthritis, right shoulder: Secondary | ICD-10-CM | POA: Diagnosis not present

## 2017-04-20 DIAGNOSIS — M75101 Unspecified rotator cuff tear or rupture of right shoulder, not specified as traumatic: Secondary | ICD-10-CM | POA: Diagnosis not present

## 2017-04-20 DIAGNOSIS — M12811 Other specific arthropathies, not elsewhere classified, right shoulder: Secondary | ICD-10-CM | POA: Diagnosis not present

## 2017-04-29 DIAGNOSIS — M12811 Other specific arthropathies, not elsewhere classified, right shoulder: Secondary | ICD-10-CM | POA: Diagnosis not present

## 2017-04-29 DIAGNOSIS — M75101 Unspecified rotator cuff tear or rupture of right shoulder, not specified as traumatic: Secondary | ICD-10-CM | POA: Diagnosis not present

## 2017-05-20 DIAGNOSIS — K219 Gastro-esophageal reflux disease without esophagitis: Secondary | ICD-10-CM | POA: Diagnosis not present

## 2017-05-20 DIAGNOSIS — Z6830 Body mass index (BMI) 30.0-30.9, adult: Secondary | ICD-10-CM | POA: Diagnosis not present

## 2017-05-20 DIAGNOSIS — M1990 Unspecified osteoarthritis, unspecified site: Secondary | ICD-10-CM | POA: Diagnosis not present

## 2017-05-20 DIAGNOSIS — Z01818 Encounter for other preprocedural examination: Secondary | ICD-10-CM | POA: Diagnosis not present

## 2017-06-16 NOTE — Pre-Procedure Instructions (Signed)
Steven Weeks  06/16/2017      [address], [address] [place] [address] Phone: [phone] Fax: [phone]    Your procedure is scheduled on February 1  Report to [facility] at [facility] A.M.  Call this number if you have problems the morning of surgery:  [phone]   Remember:  Do not eat food or drink liquids after midnight.  Continue all medications as directed by your physician except follow these medication instructions before surgery below   Take these medicines the morning of surgery with A SIP OF WATER  omeprazole (PRILOSEC)  7 days prior to surgery STOP taking any Aspirin(unless otherwise instructed by your surgeon), Aleve, Naproxen, Ibuprofen, Motrin, Advil, Goody's, BC's, all herbal medications, fish oil, and all vitamins meloxicam (MOBIC)  Follow your doctors instructions regarding your Aspirin.  If no instructions were given by your doctor, then you will need to call the prescribing office office to get instructions.        Do not wear jewelry  Do not wear lotions, powders, or cologne, or deodorant.  Men may shave face and neck.  Do not bring valuables to the hospital.  [facility] is not responsible for any belongings or valuables.  Contacts, dentures or bridgework may not be worn into surgery.  Leave your suitcase in the car.  After surgery it may be brought to your room.  For patients admitted to the hospital, discharge time will be determined by your treatment team.  Patients discharged the day of surgery will not be allowed to drive home.    Special instructions:   [facility]- Preparing For Surgery  Before surgery, you can play an important role. Because skin is not sterile, your skin needs to be as free of germs as possible. You can reduce the number of germs on your skin by washing with CHG (chlorahexidine gluconate) Soap before surgery.  CHG is an  antiseptic cleaner which kills germs and bonds with the skin to continue killing germs even after washing.  Please do not use if you have an allergy to CHG or antibacterial soaps. If your skin becomes reddened/irritated stop using the CHG.  Do not shave (including legs and underarms) for at least 48 hours prior to first CHG shower. It is OK to shave your face.  Please follow these instructions carefully.   1. Shower the NIGHT BEFORE SURGERY and the MORNING OF SURGERY with CHG.   2. If you chose to wash your hair, wash your hair first as usual with your normal shampoo.  3. After you shampoo, rinse your hair and body thoroughly to remove the shampoo.  4. Use CHG as you would any other liquid soap. You can apply CHG directly to the skin and wash gently with a scrungie or a clean washcloth.   5. Apply the CHG Soap to your body ONLY FROM THE NECK DOWN.  Do not use on open wounds or open sores. Avoid contact with your eyes, ears, mouth and genitals (private parts). Wash Face and genitals (private parts)  with your normal soap.  6. Wash thoroughly, paying special attention to the area where your surgery will be performed.  7. Thoroughly rinse your body with warm water from the neck down.  8. DO NOT shower/wash with your normal soap after using and rinsing off the CHG Soap.  9. Pat yourself dry with a CLEAN TOWEL.  10.  Wear CLEAN PAJAMAS to bed the night before surgery, wear comfortable clothes the morning of surgery  11. Place CLEAN SHEETS on your bed the night of your first shower and DO NOT SLEEP WITH PETS.    Day of Surgery: Do not apply any deodorants/lotions. Please wear clean clothes to the hospital/surgery center.      Please read over the following fact sheets that you were given.

## 2017-06-17 ENCOUNTER — Encounter (HOSPITAL_COMMUNITY): Payer: Self-pay

## 2017-06-17 ENCOUNTER — Encounter (HOSPITAL_COMMUNITY)
Admission: RE | Admit: 2017-06-17 | Discharge: 2017-06-17 | Disposition: A | Discharge status: Home / Self Care | Payer: Medicare Other | Source: Ambulatory Visit | Attending: Orthopedic Surgery | Admitting: Orthopedic Surgery

## 2017-06-17 ENCOUNTER — Other Ambulatory Visit: Payer: Self-pay

## 2017-06-17 DIAGNOSIS — C61 Malignant neoplasm of prostate: Secondary | ICD-10-CM | POA: Diagnosis not present

## 2017-06-17 DIAGNOSIS — K219 Gastro-esophageal reflux disease without esophagitis: Secondary | ICD-10-CM | POA: Diagnosis not present

## 2017-06-17 DIAGNOSIS — M19011 Primary osteoarthritis, right shoulder: Secondary | ICD-10-CM | POA: Diagnosis not present

## 2017-06-17 DIAGNOSIS — Z87891 Personal history of nicotine dependence: Secondary | ICD-10-CM | POA: Diagnosis not present

## 2017-06-17 DIAGNOSIS — Z923 Personal history of irradiation: Secondary | ICD-10-CM | POA: Diagnosis not present

## 2017-06-17 DIAGNOSIS — M25711 Osteophyte, right shoulder: Secondary | ICD-10-CM | POA: Diagnosis not present

## 2017-06-17 DIAGNOSIS — E785 Hyperlipidemia, unspecified: Secondary | ICD-10-CM | POA: Diagnosis not present

## 2017-06-17 DIAGNOSIS — Z79899 Other long term (current) drug therapy: Secondary | ICD-10-CM | POA: Diagnosis not present

## 2017-06-17 DIAGNOSIS — J309 Allergic rhinitis, unspecified: Secondary | ICD-10-CM | POA: Diagnosis not present

## 2017-06-17 DIAGNOSIS — G8918 Other acute postprocedural pain: Secondary | ICD-10-CM | POA: Diagnosis not present

## 2017-06-17 DIAGNOSIS — G4700 Insomnia, unspecified: Secondary | ICD-10-CM | POA: Diagnosis not present

## 2017-06-17 DIAGNOSIS — M75101 Unspecified rotator cuff tear or rupture of right shoulder, not specified as traumatic: Secondary | ICD-10-CM | POA: Diagnosis not present

## 2017-06-17 DIAGNOSIS — Z96611 Presence of right artificial shoulder joint: Secondary | ICD-10-CM | POA: Diagnosis not present

## 2017-06-17 DIAGNOSIS — Z9221 Personal history of antineoplastic chemotherapy: Secondary | ICD-10-CM | POA: Diagnosis not present

## 2017-06-17 DIAGNOSIS — Z85038 Personal history of other malignant neoplasm of large intestine: Secondary | ICD-10-CM | POA: Diagnosis not present

## 2017-06-17 DIAGNOSIS — M25511 Pain in right shoulder: Secondary | ICD-10-CM | POA: Diagnosis present

## 2017-06-17 DIAGNOSIS — W1830XA Fall on same level, unspecified, initial encounter: Secondary | ICD-10-CM | POA: Diagnosis present

## 2017-06-17 DIAGNOSIS — H918X2 Other specified hearing loss, left ear: Secondary | ICD-10-CM | POA: Diagnosis not present

## 2017-06-17 DIAGNOSIS — Z9049 Acquired absence of other specified parts of digestive tract: Secondary | ICD-10-CM | POA: Diagnosis not present

## 2017-06-17 DIAGNOSIS — Z471 Aftercare following joint replacement surgery: Secondary | ICD-10-CM | POA: Diagnosis not present

## 2017-06-17 DIAGNOSIS — Z96651 Presence of right artificial knee joint: Secondary | ICD-10-CM | POA: Diagnosis not present

## 2017-06-17 DIAGNOSIS — Z8546 Personal history of malignant neoplasm of prostate: Secondary | ICD-10-CM | POA: Diagnosis not present

## 2017-06-17 LAB — BASIC METABOLIC PANEL
Anion gap: 10 (ref 5–15)
BUN: 20 mg/dL (ref 6–20)
CALCIUM: 9.4 mg/dL (ref 8.9–10.3)
CO2: 24 mmol/L (ref 22–32)
CREATININE: 1.19 mg/dL (ref 0.61–1.24)
Chloride: 104 mmol/L (ref 101–111)
GFR calc non Af Amer: 57 mL/min — ABNORMAL LOW (ref >60)
Glucose, Bld: 78 mg/dL (ref 65–99)
Potassium: 4.7 mmol/L (ref 3.5–5.1)
SODIUM: 138 mmol/L (ref 135–145)

## 2017-06-17 LAB — CBC
HCT: 42.8 % (ref 39.0–52.0)
Hemoglobin: 15.2 g/dL (ref 13.0–17.0)
MCH: 33 pg (ref 26.0–34.0)
MCHC: 35.5 g/dL (ref 30.0–36.0)
MCV: 92.8 fL (ref 78.0–100.0)
Platelets: 213 10*3/uL (ref 150–400)
RBC: 4.61 MIL/uL (ref 4.22–5.81)
RDW: 12.2 % (ref 11.5–15.5)
WBC: 6.4 10*3/uL (ref 4.0–10.5)

## 2017-06-17 LAB — SURGICAL PCR SCREEN
MRSA, PCR: NEGATIVE
STAPHYLOCOCCUS AUREUS: NEGATIVE

## 2017-06-17 NOTE — Progress Notes (Signed)
Spoke with [facility] about orders. Also she stated that patients surgery time moved up to 1100am  Patient will be told to arrive at 9 am at his pre-op appointment

## 2017-06-17 NOTE — H&P (Signed)
Steven Weeks is an 77 y.o. male.    Chief Complaint: right shoulder pain  HPI: Pt is a 77 y.o. male complaining of right shoulder pain for multiple years. Pain had continually increased since the beginning. X-rays in the clinic show end-stage arthritic changes of the right shoulder. Pt has tried various conservative treatments which have failed to alleviate their symptoms, including injections and therapy. Various options are discussed with the patient. Risks, benefits and expectations were discussed with the patient. Patient understand the risks, benefits and expectations and wishes to proceed with surgery.   PCP:  Prince Solian, MD  D/C Plans: Home  PMH: Past Medical History:  Diagnosis Date  . Allergic rhinitis   . Cataracts, bilateral   . Colon cancer ([place])   . Diverticulosis    never been told  . ED (erectile dysfunction)   . Falls   . Frequency of urination   . GERD (gastroesophageal reflux disease)   . Hearing loss    more per left ear  . History of chemotherapy   . History of ear infections as a child   . History of external beam radiation therapy    prostate--  07-17-2014 to 08-20-2014--  43y  . History of hiatal hernia   . History of malignant neoplasm of colon    1994--- S/P  HEMICOLECTOMY--  CHEMOTHERAPY-- no recurrence  . History of perforated ear drum    left ear   . Hyperlipidemia   . Infection of mastoid bone    left ear  . Insomnia   . Multiple lipomas   . Nocturia   . OA (osteoarthritis)   . Olecranon bursitis   . Prostate cancer ([place])    Stage T2a,  Gleason 4+4,  PSA 7.645,  vol. 56.82cc--  43y external radiation 07-17-2014 to 08-20-2014  . Shoulder injury    right / secondary to fall   . Torn meniscus    right knee approx 12 years ago     PSH: Past Surgical History:  Procedure Laterality Date  . COLON SURGERY  1994  . COLONOSCOPY    . KNEE ARTHROSCOPY Right 2003 approx  . pilonidial cyst    . PROSTATE BIOPSY    . RADIOACTIVE SEED  IMPLANT N/A 09/07/2014   Procedure: RADIOACTIVE SEED IMPLANT;  Surgeon: Kathie Rhodes, MD;  Location: [facility];  Service: Urology;  Laterality: N/A;  . TONSILLECTOMY    . TOTAL KNEE ARTHROPLASTY Right 04/22/2015   Procedure: RIGHT TOTAL KNEE ARTHROPLASTY;  Surgeon: Paralee Cancel, MD;  Location: [facility];  Service: Orthopedics;  Laterality: Right;  Marland Kitchen VASECTOMY  1970's    Social History:  reports that he quit smoking about 36 years ago. His smoking use included cigarettes. He has a 40.50 pack-year smoking history. he has never used smokeless tobacco. He reports that he drinks alcohol. He reports that he does not use drugs.  Allergies:  No Known Allergies  Medications: No current facility-administered medications for this encounter.    Current Outpatient Medications  Medication Sig Dispense Refill  . aspirin EC 81 MG tablet Take 81 mg by mouth daily.    Marland Kitchen docusate sodium (COLACE) 100 MG capsule Take 1 capsule (100 mg total) by mouth 2 (two) times daily. (Patient taking differently: Take 100 mg by mouth daily as needed for mild constipation. ) 10 capsule 0  . ibuprofen (ADVIL,MOTRIN) 200 MG tablet Take 200 mg by mouth daily as needed for headache or moderate pain.    Marland Kitchen  Melatonin 10 MG TABS Take 10 mg by mouth at bedtime.    . meloxicam (MOBIC) 15 MG tablet Take 15 mg by mouth daily as needed for pain.    . naproxen sodium (ALEVE) 220 MG tablet Take 220 mg by mouth daily as needed (pain).    Marland Kitchen omeprazole (PRILOSEC) 20 MG capsule Take 20 mg by mouth daily.     . polyethylene glycol (MIRALAX / GLYCOLAX) packet Take 17 g by mouth 2 (two) times daily. (Patient taking differently: Take 17 g by mouth 3 (three) times a week. ) 14 each 0  . rosuvastatin (CRESTOR) 10 MG tablet Take 10 mg by mouth daily.      No results found for this or any previous visit (from the past 48 hour(s)). No results found.  ROS: Pain with rom of the right upper extremity  Physical Exam:  Alert and  oriented 77 y.o. male in no acute distress Cranial nerves 2-12 intact Cervical spine: full rom with no tenderness, nv intact distally Chest: active breath sounds bilaterally, no wheeze rhonchi or rales Heart: regular rate and rhythm, no murmur Abd: non tender non distended with active bowel sounds Hip is stable with rom  Right shoulder with limited rom and strength nv intact distally Moderately restricted strength No rashes or edema  Assessment/Plan Assessment: right shoulder cuff arthropathy  Plan: Patient will undergo a right reverse total shoulder by Dr. Veverly Fells at [facility]. Risks benefits and expectations were discussed with the patient. Patient understand risks, benefits and expectations and wishes to proceed.  Merla Riches PA-C, MPAS [facility] is now [employer] [address], [place], [place], [place] [address] Phone: [phone] [email or web address] Facebook  [employer]

## 2017-06-17 NOTE — Progress Notes (Signed)
PCP - Ravisankar Avva Cardiologist - denies  Chest x-ray - not needed EKG - not needed Stress Test - denies ECHO - denies Cardiac Cath - denies   Blood Thinner Instructions: Aspirin Instructions: stop 5 days last dose 1/27  Anesthesia review: NO  Patient denies shortness of breath, fever, cough and chest pain at PAT appointment   Patient verbalized understanding of instructions that were given to them at the PAT appointment. Patient was also instructed that they will need to review over the PAT instructions again at home before surgery.

## 2017-06-18 ENCOUNTER — Encounter (HOSPITAL_COMMUNITY)
Admission: RE | Disposition: A | Discharge status: Home / Self Care | Payer: Self-pay | Source: Ambulatory Visit | Attending: Orthopedic Surgery

## 2017-06-18 ENCOUNTER — Other Ambulatory Visit: Payer: Self-pay

## 2017-06-18 ENCOUNTER — Inpatient Hospital Stay (HOSPITAL_COMMUNITY): Payer: Medicare Other | Admitting: Certified Registered Nurse Anesthetist

## 2017-06-18 ENCOUNTER — Inpatient Hospital Stay (HOSPITAL_COMMUNITY)
Admission: RE | Admit: 2017-06-18 | Discharge: 2017-06-19 | DRG: 483 | Disposition: A | Discharge status: Home / Self Care | Payer: Medicare Other | Source: Ambulatory Visit | Attending: Orthopedic Surgery | Admitting: Orthopedic Surgery

## 2017-06-18 ENCOUNTER — Inpatient Hospital Stay (HOSPITAL_COMMUNITY): Payer: Medicare Other

## 2017-06-18 ENCOUNTER — Encounter (HOSPITAL_COMMUNITY): Payer: Self-pay | Admitting: Certified Registered Nurse Anesthetist

## 2017-06-18 DIAGNOSIS — Z85038 Personal history of other malignant neoplasm of large intestine: Secondary | ICD-10-CM | POA: Diagnosis not present

## 2017-06-18 DIAGNOSIS — Z471 Aftercare following joint replacement surgery: Secondary | ICD-10-CM | POA: Diagnosis not present

## 2017-06-18 DIAGNOSIS — M25511 Pain in right shoulder: Secondary | ICD-10-CM | POA: Diagnosis present

## 2017-06-18 DIAGNOSIS — Z923 Personal history of irradiation: Secondary | ICD-10-CM

## 2017-06-18 DIAGNOSIS — H918X2 Other specified hearing loss, left ear: Secondary | ICD-10-CM | POA: Diagnosis present

## 2017-06-18 DIAGNOSIS — C61 Malignant neoplasm of prostate: Secondary | ICD-10-CM | POA: Diagnosis not present

## 2017-06-18 DIAGNOSIS — M25711 Osteophyte, right shoulder: Secondary | ICD-10-CM | POA: Diagnosis present

## 2017-06-18 DIAGNOSIS — Z96611 Presence of right artificial shoulder joint: Secondary | ICD-10-CM | POA: Diagnosis not present

## 2017-06-18 DIAGNOSIS — J309 Allergic rhinitis, unspecified: Secondary | ICD-10-CM | POA: Diagnosis present

## 2017-06-18 DIAGNOSIS — M75101 Unspecified rotator cuff tear or rupture of right shoulder, not specified as traumatic: Secondary | ICD-10-CM | POA: Diagnosis not present

## 2017-06-18 DIAGNOSIS — G4700 Insomnia, unspecified: Secondary | ICD-10-CM | POA: Diagnosis present

## 2017-06-18 DIAGNOSIS — M19011 Primary osteoarthritis, right shoulder: Secondary | ICD-10-CM | POA: Diagnosis not present

## 2017-06-18 DIAGNOSIS — G8918 Other acute postprocedural pain: Secondary | ICD-10-CM | POA: Diagnosis not present

## 2017-06-18 DIAGNOSIS — W1830XA Fall on same level, unspecified, initial encounter: Secondary | ICD-10-CM | POA: Diagnosis present

## 2017-06-18 DIAGNOSIS — Z9049 Acquired absence of other specified parts of digestive tract: Secondary | ICD-10-CM

## 2017-06-18 DIAGNOSIS — Z87891 Personal history of nicotine dependence: Secondary | ICD-10-CM

## 2017-06-18 DIAGNOSIS — K219 Gastro-esophageal reflux disease without esophagitis: Secondary | ICD-10-CM | POA: Diagnosis present

## 2017-06-18 DIAGNOSIS — Z96651 Presence of right artificial knee joint: Secondary | ICD-10-CM | POA: Diagnosis present

## 2017-06-18 DIAGNOSIS — Z8546 Personal history of malignant neoplasm of prostate: Secondary | ICD-10-CM | POA: Diagnosis not present

## 2017-06-18 DIAGNOSIS — Z79899 Other long term (current) drug therapy: Secondary | ICD-10-CM | POA: Diagnosis not present

## 2017-06-18 DIAGNOSIS — E785 Hyperlipidemia, unspecified: Secondary | ICD-10-CM | POA: Diagnosis present

## 2017-06-18 DIAGNOSIS — Z9221 Personal history of antineoplastic chemotherapy: Secondary | ICD-10-CM | POA: Diagnosis not present

## 2017-06-18 HISTORY — PX: REVERSE SHOULDER ARTHROPLASTY: SHX5054

## 2017-06-18 SURGERY — ARTHROPLASTY, SHOULDER, TOTAL, REVERSE
Anesthesia: General | Site: Shoulder | Laterality: Right

## 2017-06-18 MED ORDER — MIDAZOLAM HCL 2 MG/2ML IJ SOLN
INTRAMUSCULAR | Status: AC
  Administered 2017-06-18: 1 mg
  Filled 2017-06-18: qty 2

## 2017-06-18 MED ORDER — THROMBIN 5000 UNITS EX SOLR
CUTANEOUS | Status: AC
  Filled 2017-06-18: qty 5000

## 2017-06-18 MED ORDER — EPINEPHRINE PF 1 MG/ML IJ SOLN
INTRAMUSCULAR | Status: AC
  Filled 2017-06-18: qty 1

## 2017-06-18 MED ORDER — FENTANYL CITRATE (PF) 100 MCG/2ML IJ SOLN
INTRAMUSCULAR | Status: DC | PRN
  Administered 2017-06-18: 50 ug via INTRAVENOUS

## 2017-06-18 MED ORDER — ONDANSETRON HCL 4 MG PO TABS: 4.0000 mg | ORAL_TABLET | Freq: Four times a day (QID) | ORAL | Status: DC | PRN

## 2017-06-18 MED ORDER — ACETAMINOPHEN 325 MG PO TABS: 650.0000 mg | ORAL_TABLET | ORAL | Status: DC | PRN

## 2017-06-18 MED ORDER — BUPIVACAINE-EPINEPHRINE (PF) 0.5% -1:200000 IJ SOLN
INTRAMUSCULAR | Status: DC | PRN
  Administered 2017-06-18: 30 mL via PERINEURAL

## 2017-06-18 MED ORDER — LIDOCAINE 2% (20 MG/ML) 5 ML SYRINGE
INTRAMUSCULAR | Status: AC
  Filled 2017-06-18: qty 5

## 2017-06-18 MED ORDER — SODIUM CHLORIDE 0.9 % IV SOLN: INTRAVENOUS | Status: DC

## 2017-06-18 MED ORDER — MELOXICAM 7.5 MG PO TABS
15.0000 mg | ORAL_TABLET | Freq: Every day | ORAL | Status: DC
  Administered 2017-06-19: 15 mg via ORAL
  Filled 2017-06-18: qty 2

## 2017-06-18 MED ORDER — ONDANSETRON HCL 4 MG/2ML IJ SOLN
INTRAMUSCULAR | Status: AC
  Filled 2017-06-18: qty 2

## 2017-06-18 MED ORDER — METOCLOPRAMIDE HCL 5 MG/ML IJ SOLN: 5.0000 mg | Freq: Three times a day (TID) | INTRAMUSCULAR | Status: DC | PRN

## 2017-06-18 MED ORDER — DEXAMETHASONE SODIUM PHOSPHATE 10 MG/ML IJ SOLN
INTRAMUSCULAR | Status: DC | PRN
  Administered 2017-06-18: 10 mg via INTRAVENOUS

## 2017-06-18 MED ORDER — PROPOFOL 10 MG/ML IV BOLUS
INTRAVENOUS | Status: DC | PRN
  Administered 2017-06-18: 150 mg via INTRAVENOUS

## 2017-06-18 MED ORDER — DEXAMETHASONE SODIUM PHOSPHATE 10 MG/ML IJ SOLN
INTRAMUSCULAR | Status: AC
  Filled 2017-06-18: qty 1

## 2017-06-18 MED ORDER — MELATONIN 3 MG PO TABS
9.0000 mg | ORAL_TABLET | Freq: Every day | ORAL | Status: DC
  Administered 2017-06-18: 9 mg via ORAL
  Filled 2017-06-18: qty 3

## 2017-06-18 MED ORDER — CHLORHEXIDINE GLUCONATE 4 % EX LIQD: 60.0000 mL | Freq: Once | CUTANEOUS | Status: DC

## 2017-06-18 MED ORDER — ALBUMIN HUMAN 5 % IV SOLN
INTRAVENOUS | Status: DC | PRN
  Administered 2017-06-18 (×2): via INTRAVENOUS

## 2017-06-18 MED ORDER — SODIUM CHLORIDE 0.9 % IR SOLN
Status: DC | PRN
  Administered 2017-06-18: 1000 mL

## 2017-06-18 MED ORDER — OXYCODONE HCL 5 MG PO TABS: 5.0000 mg | ORAL_TABLET | ORAL | Status: DC | PRN

## 2017-06-18 MED ORDER — METHOCARBAMOL 1000 MG/10ML IJ SOLN
500.0000 mg | Freq: Four times a day (QID) | INTRAVENOUS | Status: DC | PRN
  Administered 2017-06-19: 500 mg via INTRAVENOUS
  Filled 2017-06-18 (×2): qty 5

## 2017-06-18 MED ORDER — FENTANYL CITRATE (PF) 100 MCG/2ML IJ SOLN: 25.0000 ug | INTRAMUSCULAR | Status: DC | PRN

## 2017-06-18 MED ORDER — CEFAZOLIN SODIUM-DEXTROSE 2-4 GM/100ML-% IV SOLN
2.0000 g | INTRAVENOUS | Status: AC
  Administered 2017-06-18: 2 g via INTRAVENOUS
  Filled 2017-06-18: qty 100

## 2017-06-18 MED ORDER — METHOCARBAMOL 500 MG PO TABS
500.0000 mg | ORAL_TABLET | Freq: Four times a day (QID) | ORAL | Status: DC | PRN
  Administered 2017-06-19: 500 mg via ORAL
  Filled 2017-06-18: qty 1

## 2017-06-18 MED ORDER — ASPIRIN EC 81 MG PO TBEC
81.0000 mg | DELAYED_RELEASE_TABLET | Freq: Every day | ORAL | Status: DC
  Administered 2017-06-19: 81 mg via ORAL
  Filled 2017-06-18: qty 1

## 2017-06-18 MED ORDER — FENTANYL CITRATE (PF) 100 MCG/2ML IJ SOLN
INTRAMUSCULAR | Status: AC
  Administered 2017-06-18: 100 ug
  Filled 2017-06-18: qty 2

## 2017-06-18 MED ORDER — ACETAMINOPHEN 650 MG RE SUPP: 650.0000 mg | RECTAL | Status: DC | PRN

## 2017-06-18 MED ORDER — ROSUVASTATIN CALCIUM 5 MG PO TABS
10.0000 mg | ORAL_TABLET | Freq: Every day | ORAL | Status: DC
  Filled 2017-06-18: qty 2

## 2017-06-18 MED ORDER — METHOCARBAMOL 500 MG PO TABS: 500.0000 mg | ORAL_TABLET | Freq: Three times a day (TID) | ORAL | 1 refills | Status: DC | PRN

## 2017-06-18 MED ORDER — LACTATED RINGERS IV SOLN
INTRAVENOUS | Status: DC
  Administered 2017-06-18 (×2): via INTRAVENOUS

## 2017-06-18 MED ORDER — OXYCODONE HCL 5 MG PO TABS: 5.0000 mg | ORAL_TABLET | ORAL | 0 refills | Status: DC | PRN

## 2017-06-18 MED ORDER — BISACODYL 10 MG RE SUPP: 10.0000 mg | Freq: Every day | RECTAL | Status: DC | PRN

## 2017-06-18 MED ORDER — PHENOL 1.4 % MT LIQD: 1.0000 | OROMUCOSAL | Status: DC | PRN

## 2017-06-18 MED ORDER — PROPOFOL 10 MG/ML IV BOLUS
INTRAVENOUS | Status: AC
  Filled 2017-06-18: qty 20

## 2017-06-18 MED ORDER — PHENYLEPHRINE HCL 10 MG/ML IJ SOLN
INTRAVENOUS | Status: DC | PRN
  Administered 2017-06-18: 25 ug/min via INTRAVENOUS

## 2017-06-18 MED ORDER — ONDANSETRON HCL 4 MG/2ML IJ SOLN
INTRAMUSCULAR | Status: DC | PRN
  Administered 2017-06-18: 4 mg via INTRAVENOUS

## 2017-06-18 MED ORDER — FENTANYL CITRATE (PF) 250 MCG/5ML IJ SOLN
INTRAMUSCULAR | Status: AC
  Filled 2017-06-18: qty 5

## 2017-06-18 MED ORDER — EPHEDRINE SULFATE-NACL 50-0.9 MG/10ML-% IV SOSY
PREFILLED_SYRINGE | INTRAVENOUS | Status: DC | PRN
  Administered 2017-06-18 (×2): 10 mg via INTRAVENOUS
  Administered 2017-06-18: 5 mg via INTRAVENOUS

## 2017-06-18 MED ORDER — BUPIVACAINE-EPINEPHRINE 0.25% -1:200000 IJ SOLN
INTRAMUSCULAR | Status: DC | PRN
  Administered 2017-06-18: 9 mL

## 2017-06-18 MED ORDER — SUGAMMADEX SODIUM 200 MG/2ML IV SOLN
INTRAVENOUS | Status: AC
  Filled 2017-06-18: qty 2

## 2017-06-18 MED ORDER — MENTHOL 3 MG MT LOZG: 1.0000 | LOZENGE | OROMUCOSAL | Status: DC | PRN

## 2017-06-18 MED ORDER — LIDOCAINE 2% (20 MG/ML) 5 ML SYRINGE
INTRAMUSCULAR | Status: DC | PRN
  Administered 2017-06-18: 40 mg via INTRAVENOUS

## 2017-06-18 MED ORDER — BUPIVACAINE HCL (PF) 0.25 % IJ SOLN
INTRAMUSCULAR | Status: AC
Start: 2017-06-18 — End: 2017-06-18
  Filled 2017-06-18: qty 30

## 2017-06-18 MED ORDER — METOCLOPRAMIDE HCL 5 MG PO TABS: 5.0000 mg | ORAL_TABLET | Freq: Three times a day (TID) | ORAL | Status: DC | PRN

## 2017-06-18 MED ORDER — MORPHINE SULFATE (PF) 4 MG/ML IV SOLN: 2.0000 mg | INTRAVENOUS | Status: DC | PRN

## 2017-06-18 MED ORDER — POLYETHYLENE GLYCOL 3350 17 G PO PACK: 17.0000 g | PACK | ORAL | Status: DC

## 2017-06-18 MED ORDER — DOCUSATE SODIUM 100 MG PO CAPS
100.0000 mg | ORAL_CAPSULE | Freq: Two times a day (BID) | ORAL | Status: DC
  Administered 2017-06-18 – 2017-06-19 (×2): 100 mg via ORAL
  Filled 2017-06-18 (×2): qty 1

## 2017-06-18 MED ORDER — DOCUSATE SODIUM 100 MG PO CAPS: 100.0000 mg | ORAL_CAPSULE | Freq: Two times a day (BID) | ORAL | Status: DC

## 2017-06-18 MED ORDER — ROCURONIUM BROMIDE 10 MG/ML (PF) SYRINGE
PREFILLED_SYRINGE | INTRAVENOUS | Status: AC
Start: 2017-06-18 — End: 2017-06-18
  Filled 2017-06-18: qty 5

## 2017-06-18 MED ORDER — CEFAZOLIN SODIUM-DEXTROSE 2-4 GM/100ML-% IV SOLN
2.0000 g | Freq: Four times a day (QID) | INTRAVENOUS | Status: AC
  Administered 2017-06-18 – 2017-06-19 (×3): 2 g via INTRAVENOUS
  Filled 2017-06-18 (×3): qty 100

## 2017-06-18 MED ORDER — PANTOPRAZOLE SODIUM 40 MG PO TBEC
40.0000 mg | DELAYED_RELEASE_TABLET | Freq: Every day | ORAL | Status: DC
  Administered 2017-06-19: 40 mg via ORAL
  Filled 2017-06-18: qty 1

## 2017-06-18 MED ORDER — ONDANSETRON HCL 4 MG/2ML IJ SOLN: 4.0000 mg | Freq: Four times a day (QID) | INTRAMUSCULAR | Status: DC | PRN

## 2017-06-18 MED ORDER — OXYCODONE HCL 5 MG PO TABS
10.0000 mg | ORAL_TABLET | ORAL | Status: DC | PRN
Start: 2017-06-18 — End: 2017-06-19
  Administered 2017-06-18 – 2017-06-19 (×4): 10 mg via ORAL
  Filled 2017-06-18 (×4): qty 2

## 2017-06-18 MED ORDER — ROCURONIUM BROMIDE 10 MG/ML (PF) SYRINGE
PREFILLED_SYRINGE | INTRAVENOUS | Status: DC | PRN
  Administered 2017-06-18: 50 mg via INTRAVENOUS

## 2017-06-18 SURGICAL SUPPLY — 63 items
BIT DRILL 5/64X5 DISP (BIT) ×2 IMPLANT
BIT DRILL F/CENTRAL SCRW 3.2 (BIT)
BIT DRILL F/CENTRAL SCRW 3.2MM (BIT) IMPLANT
BIT DRILL TWIST 2.7 (BIT) IMPLANT
BLADE SAG 18X100X1.27 (BLADE) ×2 IMPLANT
CAPT SHLDR REVTOTAL 2 ×2 IMPLANT
CLSR STERI-STRIP ANTIMIC 1/2X4 (GAUZE/BANDAGES/DRESSINGS) ×2 IMPLANT
COVER SURGICAL LIGHT HANDLE (MISCELLANEOUS) ×2 IMPLANT
DRAPE INCISE IOBAN 66X45 STRL (DRAPES) ×2 IMPLANT
DRAPE ORTHO SPLIT 77X108 STRL (DRAPES) ×2
DRAPE SURG ORHT 6 SPLT 77X108 (DRAPES) ×2 IMPLANT
DRAPE U-SHAPE 47X51 STRL (DRAPES) ×2 IMPLANT
DRILL BIT F/CENTRAL SCRW 3.2MM (BIT)
DRSG ADAPTIC 3X8 NADH LF (GAUZE/BANDAGES/DRESSINGS) ×2 IMPLANT
DRSG PAD ABDOMINAL 8X10 ST (GAUZE/BANDAGES/DRESSINGS) ×2 IMPLANT
DURAPREP 26ML APPLICATOR (WOUND CARE) ×2 IMPLANT
ELECT BLADE 4.0 EZ CLEAN MEGAD (MISCELLANEOUS) ×2
ELECT NEEDLE TIP 2.8 STRL (NEEDLE) ×2 IMPLANT
ELECT REM PT RETURN 9FT ADLT (ELECTROSURGICAL) ×2
ELECTRODE BLDE 4.0 EZ CLN MEGD (MISCELLANEOUS) ×1 IMPLANT
ELECTRODE REM PT RTRN 9FT ADLT (ELECTROSURGICAL) ×1 IMPLANT
GAUZE SPONGE 4X4 12PLY STRL (GAUZE/BANDAGES/DRESSINGS) ×2 IMPLANT
GAUZE SPONGE 4X4 12PLY STRL LF (GAUZE/BANDAGES/DRESSINGS) ×2 IMPLANT
GLOVE BIOGEL PI ORTHO PRO 7.5 (GLOVE) ×1
GLOVE BIOGEL PI ORTHO PRO SZ8 (GLOVE) ×1
GLOVE ORTHO TXT STRL SZ7.5 (GLOVE) ×2 IMPLANT
GLOVE PI ORTHO PRO STRL 7.5 (GLOVE) ×1 IMPLANT
GLOVE PI ORTHO PRO STRL SZ8 (GLOVE) ×1 IMPLANT
GLOVE SURG ORTHO 8.5 STRL (GLOVE) ×2 IMPLANT
GOWN STRL REUS W/ TWL LRG LVL3 (GOWN DISPOSABLE) ×1 IMPLANT
GOWN STRL REUS W/ TWL XL LVL3 (GOWN DISPOSABLE) ×2 IMPLANT
GOWN STRL REUS W/TWL LRG LVL3 (GOWN DISPOSABLE) ×1
GOWN STRL REUS W/TWL XL LVL3 (GOWN DISPOSABLE) ×2
KIT BASIN OR (CUSTOM PROCEDURE TRAY) ×2 IMPLANT
KIT ROOM TURNOVER OR (KITS) ×2 IMPLANT
MANIFOLD NEPTUNE II (INSTRUMENTS) ×2 IMPLANT
NEEDLE 1/2 CIR MAYO (NEEDLE) ×2 IMPLANT
NEEDLE HYPO 25GX1X1/2 BEV (NEEDLE) ×2 IMPLANT
NS IRRIG 1000ML POUR BTL (IV SOLUTION) ×2 IMPLANT
PACK SHOULDER (CUSTOM PROCEDURE TRAY) ×2 IMPLANT
PAD ABD 8X10 STRL (GAUZE/BANDAGES/DRESSINGS) ×2 IMPLANT
PAD ARMBOARD 7.5X6 YLW CONV (MISCELLANEOUS) ×4 IMPLANT
PIN STEINMANN THREADED TIP (PIN) IMPLANT
PIN THREADED REVERSE (PIN) IMPLANT
SLING ARM FOAM STRAP XLG (SOFTGOODS) ×2 IMPLANT
SPONGE LAP 18X18 X RAY DECT (DISPOSABLE) IMPLANT
SPONGE LAP 4X18 X RAY DECT (DISPOSABLE) ×2 IMPLANT
STRIP CLOSURE SKIN 1/2X4 (GAUZE/BANDAGES/DRESSINGS) ×2 IMPLANT
SUCTION FRAZIER HANDLE 10FR (MISCELLANEOUS) ×1
SUCTION TUBE FRAZIER 10FR DISP (MISCELLANEOUS) ×1 IMPLANT
SUT FIBERWIRE #2 38 T-5 BLUE (SUTURE)
SUT MNCRL AB 4-0 PS2 18 (SUTURE) ×2 IMPLANT
SUT VIC AB 0 CT2 27 (SUTURE) ×2 IMPLANT
SUT VIC AB 2-0 CT1 27 (SUTURE) ×1
SUT VIC AB 2-0 CT1 TAPERPNT 27 (SUTURE) ×1 IMPLANT
SUT VICRYL 0 CT 1 36IN (SUTURE) ×2 IMPLANT
SUTURE FIBERWR #2 38 T-5 BLUE (SUTURE) IMPLANT
SYR CONTROL 10ML LL (SYRINGE) ×2 IMPLANT
TAPE CLOTH SURG 4X10 WHT LF (GAUZE/BANDAGES/DRESSINGS) ×2 IMPLANT
TOWEL OR 17X24 6PK STRL BLUE (TOWEL DISPOSABLE) ×2 IMPLANT
TOWEL OR 17X26 10 PK STRL BLUE (TOWEL DISPOSABLE) ×2 IMPLANT
TOWER CARTRIDGE SMART MIX (DISPOSABLE) IMPLANT
YANKAUER SUCT BULB TIP NO VENT (SUCTIONS) ×2 IMPLANT

## 2017-06-18 NOTE — Progress Notes (Signed)
Report given to ronda hunt rn as caregiver 

## 2017-06-18 NOTE — Brief Op Note (Signed)
06/18/2017  1:49 PM  PATIENT:  Steven Weeks  77 y.o. male  PRE-OPERATIVE DIAGNOSIS:  Right shoulder rotator cuff tear arthropathy  POST-OPERATIVE DIAGNOSIS:  Right shoulder rotator cuff tear arthropathy  PROCEDURE:  Procedure(s): RIGHT REVERSE SHOULDER ARTHROPLASTY (Right) Biomet Comprehensive Reverse Total Shoulder  SURGEON:  Surgeon(s) and Role:    Netta Cedars, MD - Primary  PHYSICIAN ASSISTANT:   ASSISTANTS: Ventura Bruns, PA-C   ANESTHESIA:   regional and general  EBL:  600 mL   BLOOD ADMINISTERED:none  DRAINS: none   LOCAL MEDICATIONS USED:  MARCAINE     SPECIMEN:  No Specimen  DISPOSITION OF SPECIMEN:  N/A  COUNTS:  YES  TOURNIQUET:  * No tourniquets in log *  DICTATION: .Other Dictation: Dictation Number [id number]  PLAN OF CARE: Admit to inpatient   PATIENT DISPOSITION:  PACU - hemodynamically stable.   Delay start of Pharmacological VTE agent (>24hrs) due to surgical blood loss or risk of bleeding: not applicable

## 2017-06-18 NOTE — Anesthesia Procedure Notes (Signed)
Anesthesia Regional Block: Interscalene brachial plexus block   Pre-Anesthetic Checklist: ,, timeout performed, Correct Patient, Correct Site, Correct Laterality, Correct Procedure, Correct Position, site marked, Risks and benefits discussed, pre-op evaluation,  At surgeon's request and post-op pain management  Laterality: Right  Prep: Maximum Sterile Barrier Precautions used, chloraprep       Needles:  Injection technique: Single-shot  Needle Type: Echogenic Stimulator Needle     Needle Length: 5cm  Needle Gauge: 22     Additional Needles:   Procedures:,,,, ultrasound used (permanent image in chart),,,,  Narrative:  Start time: 06/18/2017 10:21 AM End time: 06/18/2017 10:31 AM Injection made incrementally with aspirations every 5 mL. Anesthesiologist: Roderic Palau, MD  Additional Notes: 2% Lidocaine skin wheel.

## 2017-06-18 NOTE — Anesthesia Procedure Notes (Signed)
Procedure Name: Intubation Date/Time: 06/18/2017 11:14 AM Performed by: Colin Benton, CRNA Pre-anesthesia Checklist: Patient identified, Emergency Drugs available, Suction available and Patient being monitored Patient Re-evaluated:Patient Re-evaluated prior to induction Oxygen Delivery Method: Circle system utilized Preoxygenation: Pre-oxygenation with 100% oxygen Induction Type: IV induction Ventilation: Mask ventilation without difficulty and Oral airway inserted - appropriate to patient size Laryngoscope Size: Mac and 4 Grade View: Grade I Tube type: Oral Tube size: 8.0 mm Number of attempts: 1 Airway Equipment and Method: Stylet Placement Confirmation: ETT inserted through vocal cords under direct vision,  positive ETCO2 and breath sounds checked- equal and bilateral Secured at: 22 cm Tube secured with: Tape Dental Injury: Teeth and Oropharynx as per pre-operative assessment

## 2017-06-18 NOTE — Interval H&P Note (Signed)
History and Physical Interval Note:  06/18/2017 10:48 AM  Steven Weeks  has presented today for surgery, with the diagnosis of Right shoulder rotator cuff tear arthropathy  The various methods of treatment have been discussed with the patient and family. After consideration of risks, benefits and other options for treatment, the patient has consented to  Procedure(s): RIGHT REVERSE SHOULDER ARTHROPLASTY (Right) as a surgical intervention .  The patient's history has been reviewed, patient examined, no change in status, stable for surgery.  I have reviewed the patient's chart and labs.  Questions were answered to the patient's satisfaction.     Tiyonna Sardinha,STEVEN R

## 2017-06-18 NOTE — Anesthesia Preprocedure Evaluation (Addendum)
Anesthesia Evaluation  Patient identified by MRN, date of birth, ID band Patient awake    Reviewed: Allergy & Precautions, H&P , NPO status , Patient's Chart, lab work & pertinent test results  Airway Mallampati: III  TM Distance: >3 FB Neck ROM: Full    Dental no notable dental hx. (+) Teeth Intact, Dental Advisory Given   Pulmonary neg pulmonary ROS, former smoker,    Pulmonary exam normal breath sounds clear to auscultation       Cardiovascular negative cardio ROS   Rhythm:Regular Rate:Normal     Neuro/Psych negative neurological ROS  negative psych ROS   GI/Hepatic Neg liver ROS, GERD  Medicated and Controlled,  Endo/Other  negative endocrine ROS  Renal/GU negative Renal ROS  negative genitourinary   Musculoskeletal  (+) Arthritis , Osteoarthritis,    Abdominal   Peds  Hematology negative hematology ROS (+)   Anesthesia Other Findings   Reproductive/Obstetrics negative OB ROS                            Anesthesia Physical Anesthesia Plan  ASA: II  Anesthesia Plan: General   Post-op Pain Management:  Regional for Post-op pain   Induction: Intravenous  PONV Risk Score and Plan: 3 and Ondansetron, Dexamethasone and Treatment may vary due to age or medical condition  Airway Management Planned: Oral ETT  Additional Equipment:   Intra-op Plan:   Post-operative Plan: Extubation in OR  Informed Consent: I have reviewed the patients History and Physical, chart, labs and discussed the procedure including the risks, benefits and alternatives for the proposed anesthesia with the patient or authorized representative who has indicated his/her understanding and acceptance.   Dental advisory given  Plan Discussed with: CRNA  Anesthesia Plan Comments:         Anesthesia Quick Evaluation

## 2017-06-18 NOTE — Transfer of Care (Signed)
Immediate Anesthesia Transfer of Care Note  Patient: Steven Weeks  Procedure(s) Performed: RIGHT REVERSE SHOULDER ARTHROPLASTY (Right Shoulder)  Patient Location: PACU  Anesthesia Type:GA combined with regional for post-op pain  Level of Consciousness: awake, alert , oriented and patient cooperative  Airway & Oxygen Therapy: Patient Spontanous Breathing and Patient connected to nasal cannula oxygen  Post-op Assessment: Report given to RN, Post -op Vital signs reviewed and stable and Patient moving all extremities X 4  Post vital signs: Reviewed and stable  Last Vitals:  Vitals:   06/18/17 0917  BP: (!) 141/83  Pulse: 79  Resp: 18  Temp: (!) 36.3 C  SpO2: 98%    Last Pain:  Vitals:   06/18/17 0955  TempSrc:   PainSc: 3       Patients Stated Pain Goal: 2 (20/80/22 3361)  Complications: No apparent anesthesia complications

## 2017-06-18 NOTE — Discharge Instructions (Signed)
Ice to the shoulder as much as possible.  Keep a pillow or rolled blanket propped behind the elbow to keep the arm across your waist.  Do exercises every hour while awake - lap slides, pendulums, gentle hand to face and self care  No PUSH, PULL, or LIFT with the right hand.  Wear the sling while up and around, otherwise can remove.  Keep the incision covered and clean and dry for one week, then ok to get it wet in the shower  Follow up in two weeks  Call [phone] for appt.

## 2017-06-19 LAB — BASIC METABOLIC PANEL
Anion gap: 9 (ref 5–15)
BUN: 19 mg/dL (ref 6–20)
CALCIUM: 8.6 mg/dL — AB (ref 8.9–10.3)
CO2: 24 mmol/L (ref 22–32)
CREATININE: 1.23 mg/dL (ref 0.61–1.24)
Chloride: 106 mmol/L (ref 101–111)
GFR calc Af Amer: >60 mL/min (ref >60)
GFR calc non Af Amer: 54 mL/min — ABNORMAL LOW (ref >60)
GLUCOSE: 130 mg/dL — AB (ref 65–99)
Potassium: 4.5 mmol/L (ref 3.5–5.1)
SODIUM: 139 mmol/L (ref 135–145)

## 2017-06-19 LAB — HEMOGLOBIN AND HEMATOCRIT, BLOOD
HEMATOCRIT: 32.9 % — AB (ref 39.0–52.0)
HEMOGLOBIN: 11 g/dL — AB (ref 13.0–17.0)

## 2017-06-19 NOTE — Progress Notes (Signed)
Patient is discharged from room 3C05 at this time. Alert and in stable condition. IV site d/c'd and instructions read to patient and spouse with understanding verbalized. Left unit via wheelchair with all belongings at side. 

## 2017-06-19 NOTE — Evaluation (Signed)
Occupational Therapy Evaluation and Progress Rehab of Shoulder as ordered by MD Patient Details Name: Steven Weeks MRN: [id number] DOB: 05-09-1941 Today's Date: 06/19/2017    History of Present Illness Pt is a 77 y/o male s/p RIGHT REVERSE SHOULDER ARTHROPLASTY.  Pt has a past surgical history that includes Total knee arthroplasty (Right, 04/22/2015).   Clinical Impression   PTA Pt independent in ADL and mobility. Drives, plays golf, etc. Pt fully educated in compensatory strategies for ADL and provided with dc shoulder handout (reviewed in full) Pt and wife participated in dressing the Pt fully and all questions were answered. Pt also demonstrated understanding of full active exercise protocol. Handouts provided. Pt's block was still partially in - so caution was exercised during exercises and movement in general. Education complete. Thank you for the opportunity to serve the patient.     Follow Up Recommendations  DC plan and follow up therapy as arranged by surgeon;Supervision - Intermittent    Equipment Recommendations  None recommended by OT    Recommendations for Other Services       Precautions / Restrictions Precautions Precautions: Shoulder Type of Shoulder Precautions: active protocol Shoulder Interventions: Shoulder sling/immobilizer;At all times;Off for dressing/bathing/exercises Precaution Booklet Issued: Yes (comment) Precaution Comments: Shoulder dc handout reviewed in full Required Braces or Orthoses: Sling Restrictions Weight Bearing Restrictions: Yes RUE Weight Bearing: Non weight bearing      Mobility Bed Mobility Overal bed mobility: Independent             General bed mobility comments: no attempt to push or pull with RUE  Transfers Overall transfer level: Independent Equipment used: None                  Balance Overall balance assessment: No apparent balance deficits (not formally assessed)                                          ADL either performed or assessed with clinical judgement   ADL Overall ADL's : Needs assistance/impaired                                       General ADL Comments: independent for toilet transfers and LB dressing - please see shoulder section for more detailed information pertinent to sx     Vision Baseline Vision/History: Wears glasses Wears Glasses: Reading only Patient Visual Report: No change from baseline       Perception     Praxis      Pertinent Vitals/Pain Pain Assessment: Faces Faces Pain Scale: Hurts a little bit Pain Location: R shoulder - block still partially in place Pain Descriptors / Indicators: Discomfort Pain Intervention(s): Monitored during session;Repositioned     Hand Dominance Left   Extremity/Trunk Assessment Upper Extremity Assessment Upper Extremity Assessment: RUE deficits/detail RUE Deficits / Details: s/p sx  RUE: Unable to fully assess due to immobilization RUE Sensation: decreased light touch RUE Coordination: decreased gross motor   Lower Extremity Assessment Lower Extremity Assessment: Overall WFL for tasks assessed   Cervical / Trunk Assessment Cervical / Trunk Assessment: Normal(rounded shoulders/fwd head)   Communication Communication Communication: HOH   Cognition Arousal/Alertness: Awake/alert Behavior During Therapy: WFL for tasks assessed/performed Overall Cognitive Status: Within Functional Limits for tasks assessed  General Comments  Wife present at end, reviewed dc handout and wife able to assist with dressing    Exercises Exercises: Shoulder Shoulder Exercises Pendulum Exercise: PROM;Right;10 reps;Seated;Standing Shoulder Flexion: PROM;Right;10 reps;Supine(to 90) Shoulder ABduction: PROM;Right;10 reps;Seated(to 60) Shoulder External Rotation: PROM;Right;10 reps;Seated(to 30) Elbow Flexion: PROM;AROM;Right;10 reps;Seated(currently  PROM due to block) Wrist Flexion: AROM;Right;Seated Wrist Extension: AROM;Right;Seated Digit Composite Flexion: AROM;Right Composite Extension: AROM;Right Neck Flexion: AROM Neck Extension: AROM Neck Lateral Flexion - Right: AROM Neck Lateral Flexion - Left: AROM   Shoulder Instructions Shoulder Instructions Donning/doffing shirt without moving shoulder: Maximal assistance;Caregiver independent with task(donned shirt and sweatshirt) Method for sponge bathing under operated UE: Modified independent Donning/doffing sling/immobilizer: Maximal assistance;Caregiver independent with task;Patient able to independently direct caregiver Correct positioning of sling/immobilizer: Maximal assistance;Caregiver independent with task Pendulum exercises (written home exercise program): Modified independent ROM for elbow, wrist and digits of operated UE: Supervision/safety(Block still partially in place) Sling wearing schedule (on at all times/off for ADL's): Modified independent Proper positioning of operated UE when showering: Supervision/safety(educated on safety in shower) Positioning of UE while sleeping: Modified independent    Home Living Family/patient expects to be discharged to:: Private residence Living Arrangements: Spouse/significant other Available Help at Discharge: Family;Available 24 hours/day Type of Home: House Home Access: Stairs to enter [employer] of Steps: 1   Home Layout: Able to live on main level with bedroom/bathroom     Bathroom Shower/Tub: Occupational psychologist: Handicapped height Bathroom Accessibility: Yes How Accessible: Accessible via walker Home Equipment: Shower seat - built in;Grab bars - tub/shower          Prior Functioning/Environment Level of Independence: Independent        Comments: enjoys golf, drives etc        OT Problem List: Decreased range of motion;Decreased knowledge of precautions;Impaired sensation;Impaired  UE functional use;Pain      OT Treatment/Interventions:      OT Goals(Current goals can be found in the care plan section) Acute Rehab OT Goals Patient Stated Goal: get back to playing golf OT Goal Formulation: With patient/family Time For Goal Achievement: 07/03/17 Potential to Achieve Goals: Good  OT Frequency:     Barriers to D/C:            Co-evaluation              AM-PAC PT "6 Clicks" Daily Activity     Outcome Measure Help from another person eating meals?: A Little Help from another person taking care of personal grooming?: None Help from another person toileting, which includes using toliet, bedpan, or urinal?: None Help from another person bathing (including washing, rinsing, drying)?: None Help from another person to put on and taking off regular upper body clothing?: A Lot Help from another person to put on and taking off regular lower body clothing?: None 6 Click Score: 21   End of Session Equipment Utilized During Treatment: Other (comment)(sling) Nurse Communication: Mobility status;Patient requests pain meds(Fully dressed, education complete)  Activity Tolerance: Patient tolerated treatment well Patient left: in bed;with call bell/phone within reach;with family/visitor present  OT Visit Diagnosis: Pain Pain - Right/Left: Right Pain - part of body: Shoulder                Time: 6063-0160 OT Time Calculation (min): 45 min Charges:  OT General Charges $OT Visit: 1 Visit OT Evaluation $OT Eval Moderate Complexity: 1 Mod OT Treatments $Self Care/Home Management : 8-22 mins $Therapeutic Exercise: 8-22 mins G-Codes:  Hulda Humphrey OTR/L [place] 06/19/2017, 9:44 AM

## 2017-06-19 NOTE — Discharge Summary (Signed)
Orthopedic Discharge Summary        Physician Discharge Summary  Patient ID: Steven Weeks MRN: [id number] DOB/AGE: [birth year] 77 y.o.  Admit date: 06/18/2017 Discharge date:  06/19/2017  Procedures:  Procedure(s) (LRB): RIGHT REVERSE SHOULDER ARTHROPLASTY (Right)  Attending Physician:  Dr. Esmond Plants  Admission Diagnoses:   Right shoulder primary OA, end stage  Discharge Diagnoses:  Active Problems:   S/P shoulder replacement, right  Past Medical History:  Diagnosis Date  . Allergic rhinitis   . Cataracts, bilateral   . Colon cancer ([place])   . Diverticulosis    never been told  . ED (erectile dysfunction)   . Falls   . Frequency of urination   . GERD (gastroesophageal reflux disease)   . Hearing loss    more per left ear  . History of chemotherapy   . History of ear infections as a child   . History of external beam radiation therapy    prostate--  07-17-2014 to 08-20-2014--  77y  . History of hiatal hernia   . History of malignant neoplasm of colon    1994--- S/P  HEMICOLECTOMY--  CHEMOTHERAPY-- no recurrence  . History of perforated ear drum    left ear   . Hyperlipidemia   . Infection of mastoid bone    left ear  . Insomnia   . Multiple lipomas   . Nocturia   . OA (osteoarthritis)   . Olecranon bursitis   . Prostate cancer ([place])    Stage T2a,  Gleason 4+4,  PSA 7.645,  vol. 56.82cc--  77y external radiation 07-17-2014 to 08-20-2014  . Shoulder injury    right / secondary to fall   . Torn meniscus    right knee approx 12 years ago     HPI:    Right shoulder pain and dysfunction due to severe shoulder OA and rotator cuff tear arthropathy  PCP: Prince Solian, MD   Discharged Condition: good  Hospital Course:  Patient underwent the above stated procedure on 06/18/2017. Patient tolerated the procedure well and brought to the recovery room in good condition and subsequently to the floor. Patient did well post op and was stable for discharge to home on  2/2.   Discharge Exam: Incision healing well with no evidence for infection. Wiggles fingers and sensation improving.  Disposition: 01-Home or Self Care with follow up in 2 weeks   Follow-up Information    Netta Cedars, MD. Call in 2 weeks.   Specialty:  Orthopedic Surgery Why:  [phone] Contact information: [address] [place] [address] [phone]           Discharge Instructions    Call MD / Call 911   Complete by:  As directed    If you experience chest pain or shortness of breath, CALL 911 and be transported to the hospital emergency room.  If you develope a fever above 101 F, pus (white drainage) or increased drainage or redness at the wound, or calf pain, call your surgeon's office.   Constipation Prevention   Complete by:  As directed    Drink plenty of fluids.  Prune juice may be helpful.  You may use a stool softener, such as Colace (over the counter) 100 mg twice a day.  Use MiraLax (over the counter) for constipation as needed.   Diet - low sodium heart healthy   Complete by:  As directed    Driving restrictions   Complete by:  As directed  No driving for 2 weeks   Increase activity slowly as tolerated   Complete by:  As directed       Allergies as of 06/19/2017   No Known Allergies     Medication List    STOP taking these medications   ibuprofen 200 MG tablet Commonly known as:  ADVIL,MOTRIN   naproxen sodium 220 MG tablet Commonly known as:  ALEVE     TAKE these medications   aspirin EC 81 MG tablet Take 81 mg by mouth daily.   docusate sodium 100 MG capsule Commonly known as:  COLACE Take 1 capsule (100 mg total) by mouth 2 (two) times daily. What changed:    when to take this  reasons to take this   Melatonin 10 MG Tabs Take 10 mg by mouth at bedtime.   meloxicam 15 MG tablet Commonly known as:  MOBIC Take 15 mg by mouth daily as needed for pain.   methocarbamol 500 MG tablet Commonly known as:   ROBAXIN Take 1 tablet (500 mg total) by mouth 3 (three) times daily as needed.   omeprazole 20 MG capsule Commonly known as:  PRILOSEC Take 20 mg by mouth daily.   oxyCODONE 5 MG immediate release tablet Commonly known as:  ROXICODONE Take 1 tablet (5 mg total) by mouth every 4 (four) hours as needed for severe pain.   polyethylene glycol packet Commonly known as:  MIRALAX / GLYCOLAX Take 17 g by mouth 2 (two) times daily. What changed:  when to take this   rosuvastatin 10 MG tablet Commonly known as:  CRESTOR Take 10 mg by mouth daily.         Signed: Augustin Schooling 06/19/2017, 8:01 AM [facility] is now [facility] [address] [place], [place], [place] [address] Phone: [place]

## 2017-06-19 NOTE — Progress Notes (Signed)
Orthopedics Progress Note  Subjective: Patient feeling well this morning.  Still with some numbness in the fingers, block seems to be wearing off.  Objective:  Vitals:   06/19/17 0430 06/19/17 0740  BP: 136/70 113/69  Pulse: 74 82  Resp: 18 18  Temp: 97.7 F (36.5 C) 98.4 F (36.9 C)  SpO2: 95% 98%    General: Awake and alert  Musculoskeletal: right shoulder dressing changed, no erythema and no drainage Neurovascularly intact  Lab Results  Component Value Date   WBC 6.4 06/17/2017   HGB 11.0 (L) 06/19/2017   HCT 32.9 (L) 06/19/2017   MCV 92.8 06/17/2017   PLT 213 06/17/2017       Component Value Date/Time   NA 139 06/19/2017 0359   K 4.5 06/19/2017 0359   CL 106 06/19/2017 0359   CO2 24 06/19/2017 0359   GLUCOSE 130 (H) 06/19/2017 0359   BUN 19 06/19/2017 0359   CREATININE 1.23 06/19/2017 0359   CALCIUM 8.6 (L) 06/19/2017 0359   GFRNONAA 54 (L) 06/19/2017 0359   GFRAA >60 06/19/2017 0359    Lab Results  Component Value Date   INR 1.00 04/10/2015   INR 0.98 09/03/2014    Assessment/Plan: POD #1 s/p Procedure(s): RIGHT REVERSE SHOULDER ARTHROPLASTY Doing well this morning.  OT then discharge home  Doran Heater. Veverly Fells, MD 06/19/2017 7:59 AM

## 2017-06-20 NOTE — Anesthesia Postprocedure Evaluation (Signed)
Anesthesia Post Note  Patient: Steven Weeks  Procedure(s) Performed: RIGHT REVERSE SHOULDER ARTHROPLASTY (Right Shoulder)     Patient location during evaluation: PACU Anesthesia Type: General and Regional Level of consciousness: awake and alert Pain management: pain level controlled Vital Signs Assessment: post-procedure vital signs reviewed and stable Respiratory status: spontaneous breathing, nonlabored ventilation, respiratory function stable and patient connected to nasal cannula oxygen Cardiovascular status: blood pressure returned to baseline and stable Postop Assessment: no apparent nausea or vomiting Anesthetic complications: no    Last Vitals:  Vitals:   06/19/17 0430 06/19/17 0740  BP: 136/70 113/69  Pulse: 74 82  Resp: 18 18  Temp: 36.5 C 36.9 C  SpO2: 95% 98%    Last Pain:  Vitals:   06/19/17 0919  TempSrc:   PainSc: 4                  Attilio Zeitler P Lus Kriegel

## 2017-06-21 ENCOUNTER — Encounter (HOSPITAL_COMMUNITY): Payer: Self-pay | Admitting: Orthopedic Surgery

## 2017-06-21 NOTE — Op Note (Signed)
NAME:  Steven Weeks, Steven Weeks                      ACCOUNT NO.:  MEDICAL RECORD NO.:  [id number]  LOCATION:                                 FACILITY:  PHYSICIAN:  Doran Heater. Veverly Fells, M.D.      DATE OF BIRTH:  DATE OF PROCEDURE:  06/18/2017 DATE OF DISCHARGE:                              OPERATIVE REPORT   PREOPERATIVE DIAGNOSIS:  Right shoulder rotator cuff tear arthropathy.  POSTOPERATIVE DIAGNOSIS:  Right shoulder rotator cuff tear arthropathy.  PROCEDURE PERFORMED:  Right reverse total shoulder arthroplasty using Biomet comprehensive reverse system.  ASSISTANT:  Darol Destine, P.A., who scrubbed the entire procedure and necessary for satisfactory completion of surgery.  ANESTHESIA:  General anesthesia was used plus interscalene block.  ESTIMATED BLOOD LOSS:  300 mL.  FLUID REPLACEMENT:  2000 mL crystalloid.  INSTRUMENT COUNTS:  Correct.  COMPLICATIONS:  No complications.  Perioperative antibiotics were given.  INDICATIONS:  The patient is a 77 year old male with worsening right shoulder pain and dysfunction secondary to rotator cuff tear arthropathy.  The patient has had progressive pain despite conservative management, has significant shoulder dysfunction.  The patient presents for reverse total shoulder arthroplasty to restore function and eliminate pain.  Informed consent obtained.  DESCRIPTION OF PROCEDURE:  After an adequate level of anesthesia was achieved, the patient was positioned in the modified beach-chair position.  Right shoulder correctly identified, sterilely prepped and draped in usual manner.  Time-out was called.  We entered the shoulder using a standard deltopectoral approach, starting at the coracoid process, extending down the anterior humerus.  We identified the cephalic vein, took it laterally with the deltoid, pectoralis was taken medially, and the conjoined tendon was identified and retracted medially.  Deep retractors placed.  The biceps was  tenodesed in situ using 0 Vicryl figure-of-eight suture incorporating the biceps into the pectoralis tendon.  The subscapularis remnant that was in poor shape was released off the lesser tuberosity and tagged for retraction and protection of the axillary nerve.  We did a capsular release inferiorly and then released the biceps and the remnant of the rotator cuff superiorly.  We extended the shoulder, delivering the humeral head out of the wound.  We entered the proximal humerus with a 6 mm reamer, reamed up to a size 15.  We were reaming for a mini stem for the Biomet comprehensive system, which was 83 mm wide and 50 mm in diameter.  Once we had our 15 mm diameter set, we used the intramedullary resection guide to resect the humerus at the appropriate angle.  Once that was done with the oscillating saw then we removed all the pins from the humeral side.  We removed excess osteophytes off the humeral side with a rongeur and subluxed the humerus posteriorly, exposing the glenoid.  We did a 360-degree glenoid labrum removal as well as capsular removal.  We were careful to protect the axillary nerve.  Once we had good exposure of the glenoid face, we found our starting point for our guide pin and drilled that into, centered low on the glenoid face.  We then reamed for the mini baseplate, and we used  the comprehensive reverse shoulder mini baseplate.  We went ahead and reamed for that.  Once we got down to where the baseplate was well supported then we impacted the baseplate in place.  We placed a 35 mm 6.5 screw through the base of the baseplate and secured the baseplate in place.  We then did our peripheral screws which were locked, of the appropriate length.  We had secure baseplate fixation.  We then made sure that the bone on the periphery was cleaned off to where we could mount the glenosphere.  We first started out with a 41+3 glenosphere, lateralizing as the patient had very poor  function. We hoped to gain some more deltoid tensioning.  We went back to the humeral side and finished our preparation for the 15 mm mini stem.  Once we had that trial in place then we tried to reduce with the 41 standard humeral tray, and we could not get it reduced.  We had too much lateral offset that was too tight, so we removed the trial components from the humeral side, went back to the glenoid side, and then selected the 41 standard, set on the C setting, and dialed inferiorly a little bit, but we took the real 41 glenosphere and impacted that in position.  We had a nice stable well-covered glenosphere that was seated well on the glenoid on the scapular side.  Once that was in position then we went back to our humeral side.  We actually recut the humerus about 2 mm more off the top of the humerus and then rebroached for 15 mini stem and then trialed and then we felt like we could purchase it and get it reduced, but we did not fully reduce it, so we took our trials out.  We selected the real porous coat press-fit mini stem, size 15 mm diameter and 83 mm long, impacted that into position.  We were pleased with its location, and we set that at 30 degrees retroversion, and then we selected the 44 standard humeral tray and impacted that in place with the 41 bearing and that was the E1 humeral bearing with the antioxidant, and once we had that on, we were able to reduce the humerus back onto the glenoid.  We had a very nice stable reduction on the glenosphere.  Excellent stability, conjoined tight but not too tight, and the axillary nerve under some tension but not too much tension.  No gapping at all with any range of motion and excellent fluid smooth motion, so we irrigated thoroughly, removed the remnant of the subscapularis, closed the deltopectoral interval with #0 Vicryl suture followed by 2-0 Vicryl for subcutaneous closure and 4-0 Monocryl for skin.  Steri-Strips  applied followed by sterile dressing.  The patient tolerated the surgery well.     Doran Heater. Veverly Fells, M.D.     SRN/MEDQ  D:  06/18/2017  T:  06/19/2017  Job:  [id number]

## 2017-07-02 DIAGNOSIS — C61 Malignant neoplasm of prostate: Secondary | ICD-10-CM | POA: Diagnosis not present

## 2017-07-29 DIAGNOSIS — Z471 Aftercare following joint replacement surgery: Secondary | ICD-10-CM | POA: Diagnosis not present

## 2017-07-29 DIAGNOSIS — Z96611 Presence of right artificial shoulder joint: Secondary | ICD-10-CM | POA: Diagnosis not present

## 2017-09-09 DIAGNOSIS — Z96611 Presence of right artificial shoulder joint: Secondary | ICD-10-CM | POA: Diagnosis not present

## 2017-09-09 DIAGNOSIS — Z471 Aftercare following joint replacement surgery: Secondary | ICD-10-CM | POA: Diagnosis not present

## 2017-09-15 DIAGNOSIS — M9903 Segmental and somatic dysfunction of lumbar region: Secondary | ICD-10-CM | POA: Diagnosis not present

## 2017-09-15 DIAGNOSIS — M5136 Other intervertebral disc degeneration, lumbar region: Secondary | ICD-10-CM | POA: Diagnosis not present

## 2017-09-15 DIAGNOSIS — M9905 Segmental and somatic dysfunction of pelvic region: Secondary | ICD-10-CM | POA: Diagnosis not present

## 2017-09-15 DIAGNOSIS — M9904 Segmental and somatic dysfunction of sacral region: Secondary | ICD-10-CM | POA: Diagnosis not present

## 2017-09-23 DIAGNOSIS — M9904 Segmental and somatic dysfunction of sacral region: Secondary | ICD-10-CM | POA: Diagnosis not present

## 2017-09-23 DIAGNOSIS — M9905 Segmental and somatic dysfunction of pelvic region: Secondary | ICD-10-CM | POA: Diagnosis not present

## 2017-09-23 DIAGNOSIS — M5136 Other intervertebral disc degeneration, lumbar region: Secondary | ICD-10-CM | POA: Diagnosis not present

## 2017-09-23 DIAGNOSIS — M9903 Segmental and somatic dysfunction of lumbar region: Secondary | ICD-10-CM | POA: Diagnosis not present

## 2017-10-05 DIAGNOSIS — L820 Inflamed seborrheic keratosis: Secondary | ICD-10-CM | POA: Diagnosis not present

## 2017-10-05 DIAGNOSIS — L821 Other seborrheic keratosis: Secondary | ICD-10-CM | POA: Diagnosis not present

## 2017-10-05 DIAGNOSIS — D1721 Benign lipomatous neoplasm of skin and subcutaneous tissue of right arm: Secondary | ICD-10-CM | POA: Diagnosis not present

## 2017-10-05 DIAGNOSIS — D1801 Hemangioma of skin and subcutaneous tissue: Secondary | ICD-10-CM | POA: Diagnosis not present

## 2017-10-05 DIAGNOSIS — D485 Neoplasm of uncertain behavior of skin: Secondary | ICD-10-CM | POA: Diagnosis not present

## 2017-10-27 DIAGNOSIS — H906 Mixed conductive and sensorineural hearing loss, bilateral: Secondary | ICD-10-CM | POA: Diagnosis not present

## 2017-10-27 DIAGNOSIS — Z87891 Personal history of nicotine dependence: Secondary | ICD-10-CM | POA: Diagnosis not present

## 2017-10-27 DIAGNOSIS — H6123 Impacted cerumen, bilateral: Secondary | ICD-10-CM | POA: Diagnosis not present

## 2017-10-27 DIAGNOSIS — H7292 Unspecified perforation of tympanic membrane, left ear: Secondary | ICD-10-CM | POA: Diagnosis not present

## 2017-12-07 DIAGNOSIS — M1712 Unilateral primary osteoarthritis, left knee: Secondary | ICD-10-CM | POA: Diagnosis not present

## 2017-12-07 DIAGNOSIS — Z96611 Presence of right artificial shoulder joint: Secondary | ICD-10-CM | POA: Diagnosis not present

## 2017-12-07 DIAGNOSIS — M1612 Unilateral primary osteoarthritis, left hip: Secondary | ICD-10-CM | POA: Diagnosis not present

## 2017-12-07 DIAGNOSIS — M19011 Primary osteoarthritis, right shoulder: Secondary | ICD-10-CM | POA: Diagnosis not present

## 2017-12-08 DIAGNOSIS — M1612 Unilateral primary osteoarthritis, left hip: Secondary | ICD-10-CM | POA: Diagnosis not present

## 2017-12-28 DIAGNOSIS — R7301 Impaired fasting glucose: Secondary | ICD-10-CM | POA: Diagnosis not present

## 2017-12-28 DIAGNOSIS — Z0000 Encounter for general adult medical examination without abnormal findings: Secondary | ICD-10-CM | POA: Diagnosis not present

## 2017-12-28 DIAGNOSIS — E7849 Other hyperlipidemia: Secondary | ICD-10-CM | POA: Diagnosis not present

## 2017-12-28 DIAGNOSIS — Z125 Encounter for screening for malignant neoplasm of prostate: Secondary | ICD-10-CM | POA: Diagnosis not present

## 2017-12-28 DIAGNOSIS — R82998 Other abnormal findings in urine: Secondary | ICD-10-CM | POA: Diagnosis not present

## 2018-01-04 DIAGNOSIS — R7301 Impaired fasting glucose: Secondary | ICD-10-CM | POA: Diagnosis not present

## 2018-01-04 DIAGNOSIS — H9193 Unspecified hearing loss, bilateral: Secondary | ICD-10-CM | POA: Diagnosis not present

## 2018-01-04 DIAGNOSIS — C189 Malignant neoplasm of colon, unspecified: Secondary | ICD-10-CM | POA: Diagnosis not present

## 2018-01-04 DIAGNOSIS — Z0000 Encounter for general adult medical examination without abnormal findings: Secondary | ICD-10-CM | POA: Diagnosis not present

## 2018-01-06 DIAGNOSIS — R351 Nocturia: Secondary | ICD-10-CM | POA: Diagnosis not present

## 2018-01-06 DIAGNOSIS — N5201 Erectile dysfunction due to arterial insufficiency: Secondary | ICD-10-CM | POA: Diagnosis not present

## 2018-01-06 DIAGNOSIS — Z8546 Personal history of malignant neoplasm of prostate: Secondary | ICD-10-CM | POA: Diagnosis not present

## 2018-01-07 DIAGNOSIS — Z1212 Encounter for screening for malignant neoplasm of rectum: Secondary | ICD-10-CM | POA: Diagnosis not present

## 2018-01-10 DIAGNOSIS — H90A21 Sensorineural hearing loss, unilateral, right ear, with restricted hearing on the contralateral side: Secondary | ICD-10-CM | POA: Diagnosis not present

## 2018-01-10 DIAGNOSIS — H90A32 Mixed conductive and sensorineural hearing loss, unilateral, left ear with restricted hearing on the contralateral side: Secondary | ICD-10-CM | POA: Diagnosis not present

## 2018-02-12 DIAGNOSIS — Z23 Encounter for immunization: Secondary | ICD-10-CM | POA: Diagnosis not present

## 2018-05-25 DIAGNOSIS — M1612 Unilateral primary osteoarthritis, left hip: Secondary | ICD-10-CM | POA: Diagnosis not present

## 2018-05-25 DIAGNOSIS — Z471 Aftercare following joint replacement surgery: Secondary | ICD-10-CM | POA: Diagnosis not present

## 2018-05-25 DIAGNOSIS — M1712 Unilateral primary osteoarthritis, left knee: Secondary | ICD-10-CM | POA: Diagnosis not present

## 2018-05-25 DIAGNOSIS — Z96611 Presence of right artificial shoulder joint: Secondary | ICD-10-CM | POA: Diagnosis not present

## 2018-06-08 DIAGNOSIS — M1612 Unilateral primary osteoarthritis, left hip: Secondary | ICD-10-CM | POA: Diagnosis not present

## 2018-07-07 DIAGNOSIS — C61 Malignant neoplasm of prostate: Secondary | ICD-10-CM | POA: Diagnosis not present

## 2018-08-02 DIAGNOSIS — H903 Sensorineural hearing loss, bilateral: Secondary | ICD-10-CM | POA: Diagnosis not present

## 2018-08-29 DIAGNOSIS — M25552 Pain in left hip: Secondary | ICD-10-CM | POA: Diagnosis not present

## 2018-08-29 DIAGNOSIS — M1612 Unilateral primary osteoarthritis, left hip: Secondary | ICD-10-CM | POA: Diagnosis not present

## 2018-08-29 DIAGNOSIS — M1712 Unilateral primary osteoarthritis, left knee: Secondary | ICD-10-CM | POA: Diagnosis not present

## 2018-08-29 DIAGNOSIS — Z96651 Presence of right artificial knee joint: Secondary | ICD-10-CM | POA: Diagnosis not present

## 2018-09-21 DIAGNOSIS — H524 Presbyopia: Secondary | ICD-10-CM | POA: Diagnosis not present

## 2018-09-22 DIAGNOSIS — M7130 Other bursal cyst, unspecified site: Secondary | ICD-10-CM | POA: Diagnosis not present

## 2018-09-22 DIAGNOSIS — L821 Other seborrheic keratosis: Secondary | ICD-10-CM | POA: Diagnosis not present

## 2018-09-22 DIAGNOSIS — D1801 Hemangioma of skin and subcutaneous tissue: Secondary | ICD-10-CM | POA: Diagnosis not present

## 2018-09-22 DIAGNOSIS — D225 Melanocytic nevi of trunk: Secondary | ICD-10-CM | POA: Diagnosis not present

## 2018-11-08 DIAGNOSIS — M9903 Segmental and somatic dysfunction of lumbar region: Secondary | ICD-10-CM | POA: Diagnosis not present

## 2018-11-08 DIAGNOSIS — M9904 Segmental and somatic dysfunction of sacral region: Secondary | ICD-10-CM | POA: Diagnosis not present

## 2018-11-08 DIAGNOSIS — M9905 Segmental and somatic dysfunction of pelvic region: Secondary | ICD-10-CM | POA: Diagnosis not present

## 2018-11-08 DIAGNOSIS — M5136 Other intervertebral disc degeneration, lumbar region: Secondary | ICD-10-CM | POA: Diagnosis not present

## 2018-11-10 DIAGNOSIS — M9905 Segmental and somatic dysfunction of pelvic region: Secondary | ICD-10-CM | POA: Diagnosis not present

## 2018-11-10 DIAGNOSIS — M9904 Segmental and somatic dysfunction of sacral region: Secondary | ICD-10-CM | POA: Diagnosis not present

## 2018-11-10 DIAGNOSIS — M5136 Other intervertebral disc degeneration, lumbar region: Secondary | ICD-10-CM | POA: Diagnosis not present

## 2018-11-10 DIAGNOSIS — M9903 Segmental and somatic dysfunction of lumbar region: Secondary | ICD-10-CM | POA: Diagnosis not present

## 2018-11-17 DIAGNOSIS — M9905 Segmental and somatic dysfunction of pelvic region: Secondary | ICD-10-CM | POA: Diagnosis not present

## 2018-11-17 DIAGNOSIS — M9904 Segmental and somatic dysfunction of sacral region: Secondary | ICD-10-CM | POA: Diagnosis not present

## 2018-11-17 DIAGNOSIS — M5136 Other intervertebral disc degeneration, lumbar region: Secondary | ICD-10-CM | POA: Diagnosis not present

## 2018-11-17 DIAGNOSIS — M1612 Unilateral primary osteoarthritis, left hip: Secondary | ICD-10-CM | POA: Diagnosis not present

## 2018-11-17 DIAGNOSIS — M1712 Unilateral primary osteoarthritis, left knee: Secondary | ICD-10-CM | POA: Diagnosis not present

## 2018-11-17 DIAGNOSIS — M9903 Segmental and somatic dysfunction of lumbar region: Secondary | ICD-10-CM | POA: Diagnosis not present

## 2018-11-23 DIAGNOSIS — M1612 Unilateral primary osteoarthritis, left hip: Secondary | ICD-10-CM | POA: Diagnosis not present

## 2018-12-15 DIAGNOSIS — C61 Malignant neoplasm of prostate: Secondary | ICD-10-CM | POA: Diagnosis not present

## 2018-12-15 DIAGNOSIS — R7301 Impaired fasting glucose: Secondary | ICD-10-CM | POA: Diagnosis not present

## 2018-12-15 DIAGNOSIS — C189 Malignant neoplasm of colon, unspecified: Secondary | ICD-10-CM | POA: Diagnosis not present

## 2018-12-15 DIAGNOSIS — E785 Hyperlipidemia, unspecified: Secondary | ICD-10-CM | POA: Diagnosis not present

## 2019-01-01 NOTE — H&P (Signed)
TOTAL HIP ADMISSION H&P  Patient is admitted for left total hip arthroplasty, anterior approach.  Subjective:  Chief Complaint:   Left hip primary OA / pain  HPI: Steven Weeks, 78 y.o. male, has a history of pain and functional disability in the left hip(s) due to arthritis and patient has failed non-surgical conservative treatments for greater than 12 weeks to include NSAID's and/or analgesics, corticosteriod injections and activity modification.  Onset of symptoms was gradual starting 2+ years ago with gradually worsening course since that time.The patient noted no past surgery on the left hip(s).  Patient currently rates pain in the left hip at 9 out of 10 with activity. Patient has night pain, worsening of pain with activity and weight bearing, trendelenberg gait, pain that interfers with activities of daily living and pain with passive range of motion. Patient has evidence of periarticular osteophytes and joint space narrowing by imaging studies. This condition presents safety issues increasing the risk of falls.   There is no current active infection.  Risks, benefits and expectations were discussed with the patient.  Risks including but not limited to the risk of anesthesia, blood clots, nerve damage, blood vessel damage, failure of the prosthesis, infection and up to and including death.  Patient understand the risks, benefits and expectations and wishes to proceed with surgery.   PCP: Prince Solian, MD  D/C Plans:       Home   Post-op Meds:       No Rx given  Tranexamic Acid:      To be given - IV   Decadron:      Is to be given  FYI:     ASA  Norco  DME:   Rx for - RW   PT: HEP  Pharmacy: [facility]    Patient Active Problem List   Diagnosis Date Noted  . S/P shoulder replacement, right 06/18/2017  . Right hip pain 11/14/2015  . S/P right TKA 04/22/2015  . S/P knee replacement 04/22/2015  . Stage T2a Adenocarcinoma of the Prostate with a Gleason's Score of 4+4 and a  PSA of 7.65 04/17/2014   Past Medical History:  Diagnosis Date  . Allergic rhinitis   . Cataracts, bilateral   . Colon cancer ([place])   . Diverticulosis    never been told  . ED (erectile dysfunction)   . Falls   . Frequency of urination   . GERD (gastroesophageal reflux disease)   . Hearing loss    more per left ear  . History of chemotherapy   . History of ear infections as a child   . History of external beam radiation therapy    prostate--  07-17-2014 to 08-20-2014--  43y  . History of hiatal hernia   . History of malignant neoplasm of colon    1994--- S/P  HEMICOLECTOMY--  CHEMOTHERAPY-- no recurrence  . History of perforated ear drum    left ear   . Hyperlipidemia   . Infection of mastoid bone    left ear  . Insomnia   . Multiple lipomas   . Nocturia   . OA (osteoarthritis)   . Olecranon bursitis   . Prostate cancer ([place])    Stage T2a,  Gleason 4+4,  PSA 7.645,  vol. 56.82cc--  43y external radiation 07-17-2014 to 08-20-2014  . Shoulder injury    right / secondary to fall   . Torn meniscus    right knee approx 12 years ago     Past  Surgical History:  Procedure Laterality Date  . COLON SURGERY  1994  . COLONOSCOPY    . KNEE ARTHROSCOPY Right 2003 approx  . pilonidial cyst    . PROSTATE BIOPSY    . RADIOACTIVE SEED IMPLANT N/A 09/07/2014   Procedure: RADIOACTIVE SEED IMPLANT;  Surgeon: Kathie Rhodes, MD;  Location: [facility];  Service: Urology;  Laterality: N/A;  . REVERSE SHOULDER ARTHROPLASTY Right 06/18/2017   Procedure: RIGHT REVERSE SHOULDER ARTHROPLASTY;  Surgeon: Netta Cedars, MD;  Location: [place];  Service: Orthopedics;  Laterality: Right;  . TONSILLECTOMY    . TOTAL KNEE ARTHROPLASTY Right 04/22/2015   Procedure: RIGHT TOTAL KNEE ARTHROPLASTY;  Surgeon: Paralee Cancel, MD;  Location: [facility];  Service: Orthopedics;  Laterality: Right;  Marland Kitchen VASECTOMY  1970's    No current facility-administered medications for this encounter.    Current  Outpatient Medications  Medication Sig Dispense Refill Last Dose  . aspirin EC 81 MG tablet Take 81 mg by mouth daily.   Past Week at Unknown time  . docusate sodium (COLACE) 100 MG capsule Take 1 capsule (100 mg total) by mouth 2 (two) times daily. (Patient taking differently: Take 100 mg by mouth daily as needed for mild constipation. ) 10 capsule 0 01/03/2016 at Unknown time  . Melatonin 10 MG TABS Take 10 mg by mouth at bedtime.   06/17/2017 at Unknown time  . meloxicam (MOBIC) 15 MG tablet Take 15 mg by mouth daily as needed for pain.   Past Month at Unknown time  . methocarbamol (ROBAXIN) 500 MG tablet Take 1 tablet (500 mg total) by mouth 3 (three) times daily as needed. 60 tablet 1   . omeprazole (PRILOSEC) 20 MG capsule Take 20 mg by mouth daily.    06/18/2017 at Unknown time  . oxyCODONE (ROXICODONE) 5 MG immediate release tablet Take 1 tablet (5 mg total) by mouth every 4 (four) hours as needed for severe pain. 30 tablet 0   . polyethylene glycol (MIRALAX / GLYCOLAX) packet Take 17 g by mouth 2 (two) times daily. (Patient taking differently: Take 17 g by mouth 3 (three) times a week. ) 14 each 0 Past Week at Unknown time  . rosuvastatin (CRESTOR) 10 MG tablet Take 10 mg by mouth daily.   06/17/2017 at Unknown time   No Known Allergies  Social History   Tobacco Use  . Smoking status: Former Smoker    Packs/day: 1.50    Years: 27.00    Pack years: 40.50    Types: Cigarettes    Quit date: 05/18/1981    Years since quitting: 37.6  . Smokeless tobacco: Never Used  Substance Use Topics  . Alcohol use: Yes    Alcohol/week: 0.0 standard drinks    Comment: daily - 2 to 5 beers    Family History  Problem Relation Age of Onset  . Cancer Mother        colon  . Hypertension Mother   . Cancer Father        lung and bladder     Review of Systems  Constitutional: Negative.   HENT: Negative.   Eyes: Negative.   Respiratory: Negative.   Cardiovascular: Negative.   Gastrointestinal:  Positive for heartburn.  Genitourinary: Positive for frequency.  Musculoskeletal: Positive for joint pain.  Skin: Negative.   Neurological: Negative.   Endo/Heme/Allergies: Positive for environmental allergies.  Psychiatric/Behavioral: Negative.     Objective:  Physical Exam  Constitutional: He is oriented to person, place, and  time. He appears well-developed.  HENT:  Head: Normocephalic.  Eyes: Pupils are equal, round, and reactive to light.  Neck: Neck supple. No JVD present. No tracheal deviation present. No thyromegaly present.  Cardiovascular: Normal rate, regular rhythm and intact distal pulses.  Respiratory: Effort normal and breath sounds normal. No respiratory distress. He has no wheezes.  GI: Soft. There is no abdominal tenderness. There is no guarding.  Musculoskeletal:     Left hip: He exhibits decreased range of motion, decreased strength, tenderness and bony tenderness. He exhibits no swelling, no deformity and no laceration.  Lymphadenopathy:    He has no cervical adenopathy.  Neurological: He is alert and oriented to person, place, and time.  Skin: Skin is warm and dry.  Psychiatric: He has a normal mood and affect.      Labs:  Estimated body mass index is 29.43 kg/m as calculated from the following:   Height as of 06/18/17: 5\' 11"  (1.803 m).   Weight as of 06/18/17: 95.7 kg.   Imaging Review Plain radiographs demonstrate severe degenerative joint disease of the left hip(s). The bone quality appears to be good for age and reported activity level.      Assessment/Plan:  End stage arthritis, left hip(s)  The patient history, physical examination, clinical judgement of the provider and imaging studies are consistent with end stage degenerative joint disease of the left hip(s) and total hip arthroplasty is deemed medically necessary. The treatment options including medical management, injection therapy, arthroscopy and arthroplasty were discussed at length.  The risks and benefits of total hip arthroplasty were presented and reviewed. The risks due to aseptic loosening, infection, stiffness, dislocation/subluxation,  thromboembolic complications and other imponderables were discussed.  The patient acknowledged the explanation, agreed to proceed with the plan and consent was signed. Patient is being admitted for inpatient treatment for surgery, pain control, PT, OT, prophylactic antibiotics, VTE prophylaxis, progressive ambulation and ADL's and discharge planning.The patient is planning to be discharged home.    [place] Pugh Lalo Tromp   PA-C  01/01/2019, 10:31 PM

## 2019-01-03 DIAGNOSIS — C61 Malignant neoplasm of prostate: Secondary | ICD-10-CM | POA: Diagnosis not present

## 2019-01-04 NOTE — Patient Instructions (Addendum)
YOU NEED TO HAVE A COVID 19 TEST ON 01-09-2019 at 930. THIS TEST MUST BE DONE BEFORE SURGERY, [address]  [address], [place] , [id number]. ONCE YOUR COVID TEST IS COMPLETED, PLEASE BEGIN THE QUARANTINE INSTRUCTIONS AS OUTLINED IN YOUR HANDOUT.                Steven Weeks     Your procedure is scheduled on: 01-12-2019  Report to [facility] Main  Entrance  Report to  Short stay at  530 AM   [address] [place]. NO VISITORS ARE ALLOWED IN SHORT STAY OR RECOVERY ROOM.   Call this number if you have problems the morning of surgery [phone]    Remember:BRUSH YOUR TEETH MORNING OF SURGERY AND RINSE YOUR MOUTH OUT, NO CHEWING GUM CANDY OR MINTS.  NO SOLID FOOD AFTER MIDNIGHT THE NIGHT PRIOR TO SURGERY. NOTHING BY MOUTH EXCEPT CLEAR LIQUIDS UNTIL 415 am . PLEASE FINISH ENSURE DRINK PER SURGEON ORDER  WHICH NEEDS TO BE COMPLETED AT  415 am.   CLEAR LIQUID DIET   Foods Allowed                                                                     Foods Excluded  Coffee and tea, regular and decaf                             liquids that you cannot  Plain Jell-O any favor except red or purple                                           see through such as: Fruit ices (not with fruit pulp)                                     milk, soups, orange juice  Iced Popsicles                                    All solid food Carbonated beverages, regular and diet                                    Cranberry, grape and apple juices Sports drinks like Gatorade Lightly seasoned clear broth or consume(fat free) Sugar, honey syrup  Sample Menu Breakfast                                Lunch                                     Supper Cranberry juice  Beef broth                            Chicken broth Jell-O                                     Grape juice                           Apple juice Coffee or tea                         Jell-O                                      Popsicle                                                Coffee or tea                        Coffee or tea  _____________________________________________________________________      Take these medicines the morning of surgery with A SIP OF WATER: omeprazole (prilosec, simvastatin                                You may not have any metal on your body including hair pins and              piercings  Do not wear jewelry, make-up, lotions, powders or perfumes, deodorant                         Men may shave face and neck.   Do not bring valuables to the hospital. [place].  Contacts, dentures or bridgework may not be worn into surgery.  Leave suitcase in the car. After surgery it may be brought to your room.      _____________________________________________________________________             [facility] - Preparing for Surgery Before surgery, you can play an important role.  Because skin is not sterile, your skin needs to be as free of germs as possible.  You can reduce the number of germs on your skin by washing with CHG (chlorahexidine gluconate) soap before surgery.  CHG is an antiseptic cleaner which kills germs and bonds with the skin to continue killing germs even after washing. Please DO NOT use if you have an allergy to CHG or antibacterial soaps.  If your skin becomes reddened/irritated stop using the CHG and inform your nurse when you arrive at Short Stay. Do not shave (including legs and underarms) for at least 48 hours prior to the first CHG shower.  You may shave your face/neck. Please follow these instructions carefully:  1.  Shower with CHG Soap the night before surgery and the  morning of Surgery.  2.  If you choose to wash your hair, wash your hair  first as usual with your  normal  shampoo.  3.  After you shampoo, rinse your hair and body thoroughly to remove the  shampoo.                            4.  Use CHG as you would any other liquid soap.  You can apply chg directly  to the skin and wash                       Gently with a scrungie or clean washcloth.  5.  Apply the CHG Soap to your body ONLY FROM THE NECK DOWN.   Do not use on face/ open                           Wound or open sores. Avoid contact with eyes, ears mouth and genitals (private parts).                       Wash face,  Genitals (private parts) with your normal soap.             6.  Wash thoroughly, paying special attention to the area where your surgery  will be performed.  7.  Thoroughly rinse your body with warm water from the neck down.  8.  DO NOT shower/wash with your normal soap after using and rinsing off  the CHG Soap.                9.  Pat yourself dry with a clean towel.            10.  Wear clean pajamas.            11.  Place clean sheets on your bed the night of your first shower and do not  sleep with pets. Day of Surgery : Do not apply any lotions/deodorants the morning of surgery.  Please wear clean clothes to the hospital/surgery center.  FAILURE TO FOLLOW THESE INSTRUCTIONS MAY RESULT IN THE CANCELLATION OF YOUR SURGERY PATIENT SIGNATURE_________________________________  NURSE SIGNATURE__________________________________  ________________________________________________________________________   Steven Weeks  An incentive spirometer is a tool that can help keep your lungs clear and active. This tool measures how well you are filling your lungs with each breath. Taking long deep breaths may help reverse or decrease the chance of developing breathing (pulmonary) problems (especially infection) following:  A long period of time when you are unable to move or be active. BEFORE THE PROCEDURE   If the spirometer includes an indicator to show your best effort, your nurse or respiratory therapist will set it to a desired goal.  If possible, sit up straight or lean  slightly forward. Try not to slouch.  Hold the incentive spirometer in an upright position. INSTRUCTIONS FOR USE  1. Sit on the edge of your bed if possible, or sit up as far as you can in bed or on a chair. 2. Hold the incentive spirometer in an upright position. 3. Breathe out normally. 4. Place the mouthpiece in your mouth and seal your lips tightly around it. 5. Breathe in slowly and as deeply as possible, raising the piston or the ball toward the top of the column. 6. Hold your breath for 3-5 seconds or for as long as possible. Allow the piston or ball to fall to the bottom of the column. 7. Remove  the mouthpiece from your mouth and breathe out normally. 8. Rest for a few seconds and repeat Steps 1 through 7 at least 10 times every 1-2 hours when you are awake. Take your time and take a few normal breaths between deep breaths. 9. The spirometer may include an indicator to show your best effort. Use the indicator as a goal to work toward during each repetition. 10. After each set of 10 deep breaths, practice coughing to be sure your lungs are clear. If you have an incision (the cut made at the time of surgery), support your incision when coughing by placing a pillow or rolled up towels firmly against it. Once you are able to get out of bed, walk around indoors and cough well. You may stop using the incentive spirometer when instructed by your caregiver.  RISKS AND COMPLICATIONS  Take your time so you do not get dizzy or light-headed.  If you are in pain, you may need to take or ask for pain medication before doing incentive spirometry. It is harder to take a deep breath if you are having pain. AFTER USE  Rest and breathe slowly and easily.  It can be helpful to keep track of a log of your progress. Your caregiver can provide you with a simple table to help with this. If you are using the spirometer at home, follow these instructions: [place] IF:   You are having difficultly  using the spirometer.  You have trouble using the spirometer as often as instructed.  Your pain medication is not giving enough relief while using the spirometer.  You develop fever of 100.5 F (38.1 C) or higher. SEEK IMMEDIATE MEDICAL CARE IF:   You cough up bloody sputum that had not been present before.  You develop fever of 102 F (38.9 C) or greater.  You develop worsening pain at or near the incision site. MAKE SURE YOU:   Understand these instructions.  Will watch your condition.  Will get help right away if you are not doing well or get worse. Document Released: 09/14/2006 Document Revised: 07/27/2011 Document Reviewed: 11/15/2006 Exit[employer] Patient Information 2014 [employer], [place].   ________________________________________________________________________  WHAT IS A BLOOD TRANSFUSION? Blood Transfusion Information  A transfusion is the replacement of blood or some of its parts. Blood is made up of multiple cells which provide different functions.  Red blood cells carry oxygen and are used for blood loss replacement.  White blood cells fight against infection.  Platelets control bleeding.  Plasma helps clot blood.  Other blood products are available for specialized needs, such as hemophilia or other clotting disorders. BEFORE THE TRANSFUSION  Who gives blood for transfusions?   Healthy volunteers who are fully evaluated to make sure their blood is safe. This is blood bank blood. Transfusion therapy is the safest it has ever been in the practice of medicine. Before blood is taken from a donor, a complete history is taken to make sure that person has no history of diseases nor engages in risky social behavior (examples are intravenous drug use or sexual activity with multiple partners). The donor's travel history is screened to minimize risk of transmitting infections, such as malaria. The donated blood is tested for signs of infectious diseases, such as HIV and  hepatitis. The blood is then tested to be sure it is compatible with you in order to minimize the chance of a transfusion reaction. If you or a relative donates blood, this is often done in anticipation of  surgery and is not appropriate for emergency situations. It takes many days to process the donated blood. RISKS AND COMPLICATIONS Although transfusion therapy is very safe and saves many lives, the main dangers of transfusion include:   Getting an infectious disease.  Developing a transfusion reaction. This is an allergic reaction to something in the blood you were given. Every precaution is taken to prevent this. The decision to have a blood transfusion has been considered carefully by your caregiver before blood is given. Blood is not given unless the benefits outweigh the risks. AFTER THE TRANSFUSION  Right after receiving a blood transfusion, you will usually feel much better and more energetic. This is especially true if your red blood cells have gotten low (anemic). The transfusion raises the level of the red blood cells which carry oxygen, and this usually causes an energy increase.  The nurse administering the transfusion will monitor you carefully for complications. HOME CARE INSTRUCTIONS  No special instructions are needed after a transfusion. You may find your energy is better. Speak with your caregiver about any limitations on activity for underlying diseases you may have. SEEK MEDICAL CARE IF:   Your condition is not improving after your transfusion.  You develop redness or irritation at the intravenous (IV) site. SEEK IMMEDIATE MEDICAL CARE IF:  Any of the following symptoms occur over the next 12 hours:  Shaking chills.  You have a temperature by mouth above 102 F (38.9 C), not controlled by medicine.  Chest, back, or muscle pain.  People around you feel you are not acting correctly or are confused.  Shortness of breath or difficulty breathing.  Dizziness and  fainting.  You get a rash or develop hives.  You have a decrease in urine output.  Your urine turns a dark color or changes to pink, red, or brown. Any of the following symptoms occur over the next 10 days:  You have a temperature by mouth above 102 F (38.9 C), not controlled by medicine.  Shortness of breath.  Weakness after normal activity.  The white part of the eye turns yellow (jaundice).  You have a decrease in the amount of urine or are urinating less often.  Your urine turns a dark color or changes to pink, red, or brown. Document Released: 05/01/2000 Document Revised: 07/27/2011 Document Reviewed: 12/19/2007 [facility] Patient Information 2014 [place], [place].  _______________________________________________________________________

## 2019-01-09 ENCOUNTER — Encounter (HOSPITAL_COMMUNITY)
Admission: RE | Admit: 2019-01-09 | Discharge: 2019-01-09 | Disposition: A | Discharge status: Home / Self Care | Payer: Medicare Other | Source: Ambulatory Visit | Attending: Orthopedic Surgery | Admitting: Orthopedic Surgery

## 2019-01-09 ENCOUNTER — Encounter (HOSPITAL_COMMUNITY): Payer: Self-pay

## 2019-01-09 ENCOUNTER — Other Ambulatory Visit: Payer: Self-pay

## 2019-01-09 ENCOUNTER — Other Ambulatory Visit (HOSPITAL_COMMUNITY)
Admission: RE | Admit: 2019-01-09 | Discharge: 2019-01-09 | Disposition: A | Discharge status: Home / Self Care | Payer: Medicare Other | Source: Ambulatory Visit | Attending: Orthopedic Surgery | Admitting: Orthopedic Surgery

## 2019-01-09 ENCOUNTER — Encounter (INDEPENDENT_AMBULATORY_CARE_PROVIDER_SITE_OTHER): Payer: Self-pay

## 2019-01-09 DIAGNOSIS — Z79891 Long term (current) use of opiate analgesic: Secondary | ICD-10-CM | POA: Diagnosis not present

## 2019-01-09 DIAGNOSIS — Z8052 Family history of malignant neoplasm of bladder: Secondary | ICD-10-CM | POA: Diagnosis not present

## 2019-01-09 DIAGNOSIS — Z801 Family history of malignant neoplasm of trachea, bronchus and lung: Secondary | ICD-10-CM | POA: Diagnosis not present

## 2019-01-09 DIAGNOSIS — K219 Gastro-esophageal reflux disease without esophagitis: Secondary | ICD-10-CM | POA: Diagnosis not present

## 2019-01-09 DIAGNOSIS — Z85038 Personal history of other malignant neoplasm of large intestine: Secondary | ICD-10-CM | POA: Diagnosis not present

## 2019-01-09 DIAGNOSIS — Z7982 Long term (current) use of aspirin: Secondary | ICD-10-CM | POA: Diagnosis not present

## 2019-01-09 DIAGNOSIS — Z8546 Personal history of malignant neoplasm of prostate: Secondary | ICD-10-CM | POA: Diagnosis not present

## 2019-01-09 DIAGNOSIS — Z471 Aftercare following joint replacement surgery: Secondary | ICD-10-CM | POA: Diagnosis not present

## 2019-01-09 DIAGNOSIS — E663 Overweight: Secondary | ICD-10-CM | POA: Diagnosis not present

## 2019-01-09 DIAGNOSIS — Z96641 Presence of right artificial hip joint: Secondary | ICD-10-CM | POA: Diagnosis not present

## 2019-01-09 DIAGNOSIS — Z79890 Hormone replacement therapy (postmenopausal): Secondary | ICD-10-CM | POA: Diagnosis not present

## 2019-01-09 DIAGNOSIS — Z87891 Personal history of nicotine dependence: Secondary | ICD-10-CM | POA: Diagnosis not present

## 2019-01-09 DIAGNOSIS — Z96642 Presence of left artificial hip joint: Secondary | ICD-10-CM | POA: Diagnosis not present

## 2019-01-09 DIAGNOSIS — Z96651 Presence of right artificial knee joint: Secondary | ICD-10-CM | POA: Diagnosis not present

## 2019-01-09 DIAGNOSIS — Z800 Family history of malignant neoplasm of digestive organs: Secondary | ICD-10-CM | POA: Diagnosis not present

## 2019-01-09 DIAGNOSIS — Z20828 Contact with and (suspected) exposure to other viral communicable diseases: Secondary | ICD-10-CM | POA: Diagnosis not present

## 2019-01-09 DIAGNOSIS — M1611 Unilateral primary osteoarthritis, right hip: Secondary | ICD-10-CM | POA: Diagnosis not present

## 2019-01-09 DIAGNOSIS — Z8249 Family history of ischemic heart disease and other diseases of the circulatory system: Secondary | ICD-10-CM | POA: Diagnosis not present

## 2019-01-09 DIAGNOSIS — Z6826 Body mass index (BMI) 26.0-26.9, adult: Secondary | ICD-10-CM | POA: Diagnosis not present

## 2019-01-09 DIAGNOSIS — E785 Hyperlipidemia, unspecified: Secondary | ICD-10-CM | POA: Diagnosis not present

## 2019-01-09 DIAGNOSIS — Z79899 Other long term (current) drug therapy: Secondary | ICD-10-CM | POA: Diagnosis not present

## 2019-01-09 DIAGNOSIS — K449 Diaphragmatic hernia without obstruction or gangrene: Secondary | ICD-10-CM | POA: Diagnosis not present

## 2019-01-09 DIAGNOSIS — Z96611 Presence of right artificial shoulder joint: Secondary | ICD-10-CM | POA: Diagnosis not present

## 2019-01-09 DIAGNOSIS — Z01812 Encounter for preprocedural laboratory examination: Secondary | ICD-10-CM | POA: Insufficient documentation

## 2019-01-09 DIAGNOSIS — M1612 Unilateral primary osteoarthritis, left hip: Secondary | ICD-10-CM | POA: Diagnosis not present

## 2019-01-09 DIAGNOSIS — Z9221 Personal history of antineoplastic chemotherapy: Secondary | ICD-10-CM | POA: Diagnosis not present

## 2019-01-09 DIAGNOSIS — Z923 Personal history of irradiation: Secondary | ICD-10-CM | POA: Diagnosis not present

## 2019-01-09 DIAGNOSIS — Z791 Long term (current) use of non-steroidal anti-inflammatories (NSAID): Secondary | ICD-10-CM | POA: Diagnosis not present

## 2019-01-09 LAB — CBC
HCT: 41.1 % (ref 39.0–52.0)
Hemoglobin: 13.5 g/dL (ref 13.0–17.0)
MCH: 31.9 pg (ref 26.0–34.0)
MCHC: 32.8 g/dL (ref 30.0–36.0)
MCV: 97.2 fL (ref 80.0–100.0)
Platelets: 210 10*3/uL (ref 150–400)
RBC: 4.23 MIL/uL (ref 4.22–5.81)
RDW: 11.9 % (ref 11.5–15.5)
WBC: 5.4 10*3/uL (ref 4.0–10.5)
nRBC: 0 % (ref 0.0–0.2)

## 2019-01-09 LAB — SURGICAL PCR SCREEN
MRSA, PCR: NEGATIVE
Staphylococcus aureus: NEGATIVE

## 2019-01-10 LAB — SARS CORONAVIRUS 2 (TAT 6-24 HRS): SARS Coronavirus 2: NEGATIVE

## 2019-01-10 NOTE — Progress Notes (Addendum)
PCP - dr Berneta Sages [facility] , medical clearance dr Dagmar Hait 01-10-19 on chart Cardiologist -   Chest x-ray -  EKG - 12-15-18 dr Dagmar Hait on chart Stress Test -  ECHO -  Cardiac Cath -   Sleep Study -  CPAP -   Fasting Blood Sugar -  Checks Blood Sugar _____ times a day  Blood Thinner Instructions: Aspirin Instructions: Last Dose:  Anesthesia review:   Patient denies shortness of breath, fever, cough and chest pain at PAT appointment   Patient verbalized understanding of instructions that were given to them at the PAT appointment. Patient was also instructed that they will need to review over the PAT instructions again at home before surgery.

## 2019-01-12 ENCOUNTER — Other Ambulatory Visit: Payer: Self-pay

## 2019-01-12 ENCOUNTER — Inpatient Hospital Stay (HOSPITAL_COMMUNITY): Payer: Medicare Other

## 2019-01-12 ENCOUNTER — Inpatient Hospital Stay (HOSPITAL_COMMUNITY)
Admission: RE | Admit: 2019-01-12 | Discharge: 2019-01-13 | DRG: 470 | Disposition: A | Discharge status: Home / Self Care | Payer: Medicare Other | Attending: Orthopedic Surgery | Admitting: Orthopedic Surgery

## 2019-01-12 ENCOUNTER — Inpatient Hospital Stay (HOSPITAL_COMMUNITY): Payer: Medicare Other | Admitting: Certified Registered"

## 2019-01-12 ENCOUNTER — Inpatient Hospital Stay (HOSPITAL_COMMUNITY): Payer: Medicare Other | Admitting: Physician Assistant

## 2019-01-12 ENCOUNTER — Encounter (HOSPITAL_COMMUNITY): Payer: Self-pay | Admitting: *Deleted

## 2019-01-12 ENCOUNTER — Encounter (HOSPITAL_COMMUNITY)
Admission: RE | Disposition: A | Discharge status: Home / Self Care | Payer: Self-pay | Source: Other Acute Inpatient Hospital | Attending: Orthopedic Surgery

## 2019-01-12 DIAGNOSIS — Z96642 Presence of left artificial hip joint: Secondary | ICD-10-CM

## 2019-01-12 DIAGNOSIS — Z800 Family history of malignant neoplasm of digestive organs: Secondary | ICD-10-CM | POA: Diagnosis not present

## 2019-01-12 DIAGNOSIS — Z8249 Family history of ischemic heart disease and other diseases of the circulatory system: Secondary | ICD-10-CM | POA: Diagnosis not present

## 2019-01-12 DIAGNOSIS — Z7982 Long term (current) use of aspirin: Secondary | ICD-10-CM | POA: Diagnosis not present

## 2019-01-12 DIAGNOSIS — Z85038 Personal history of other malignant neoplasm of large intestine: Secondary | ICD-10-CM | POA: Diagnosis not present

## 2019-01-12 DIAGNOSIS — Z96651 Presence of right artificial knee joint: Secondary | ICD-10-CM | POA: Diagnosis present

## 2019-01-12 DIAGNOSIS — E663 Overweight: Secondary | ICD-10-CM | POA: Diagnosis present

## 2019-01-12 DIAGNOSIS — Z923 Personal history of irradiation: Secondary | ICD-10-CM

## 2019-01-12 DIAGNOSIS — E785 Hyperlipidemia, unspecified: Secondary | ICD-10-CM | POA: Diagnosis present

## 2019-01-12 DIAGNOSIS — M1612 Unilateral primary osteoarthritis, left hip: Secondary | ICD-10-CM | POA: Diagnosis present

## 2019-01-12 DIAGNOSIS — Z801 Family history of malignant neoplasm of trachea, bronchus and lung: Secondary | ICD-10-CM

## 2019-01-12 DIAGNOSIS — Z8052 Family history of malignant neoplasm of bladder: Secondary | ICD-10-CM

## 2019-01-12 DIAGNOSIS — Z87891 Personal history of nicotine dependence: Secondary | ICD-10-CM | POA: Diagnosis not present

## 2019-01-12 DIAGNOSIS — Z79899 Other long term (current) drug therapy: Secondary | ICD-10-CM

## 2019-01-12 DIAGNOSIS — Z79890 Hormone replacement therapy (postmenopausal): Secondary | ICD-10-CM | POA: Diagnosis not present

## 2019-01-12 DIAGNOSIS — K219 Gastro-esophageal reflux disease without esophagitis: Secondary | ICD-10-CM | POA: Diagnosis present

## 2019-01-12 DIAGNOSIS — Z419 Encounter for procedure for purposes other than remedying health state, unspecified: Secondary | ICD-10-CM

## 2019-01-12 DIAGNOSIS — Z96611 Presence of right artificial shoulder joint: Secondary | ICD-10-CM | POA: Diagnosis present

## 2019-01-12 DIAGNOSIS — Z9221 Personal history of antineoplastic chemotherapy: Secondary | ICD-10-CM | POA: Diagnosis not present

## 2019-01-12 DIAGNOSIS — Z8546 Personal history of malignant neoplasm of prostate: Secondary | ICD-10-CM | POA: Diagnosis not present

## 2019-01-12 DIAGNOSIS — Z20828 Contact with and (suspected) exposure to other viral communicable diseases: Secondary | ICD-10-CM | POA: Diagnosis present

## 2019-01-12 DIAGNOSIS — Z6826 Body mass index (BMI) 26.0-26.9, adult: Secondary | ICD-10-CM | POA: Diagnosis not present

## 2019-01-12 DIAGNOSIS — Z79891 Long term (current) use of opiate analgesic: Secondary | ICD-10-CM

## 2019-01-12 DIAGNOSIS — Z791 Long term (current) use of non-steroidal anti-inflammatories (NSAID): Secondary | ICD-10-CM | POA: Diagnosis not present

## 2019-01-12 HISTORY — PX: TOTAL HIP ARTHROPLASTY: SHX124

## 2019-01-12 LAB — TYPE AND SCREEN
ABO/RH(D): B POS
Antibody Screen: NEGATIVE

## 2019-01-12 SURGERY — ARTHROPLASTY, HIP, TOTAL, ANTERIOR APPROACH
Anesthesia: Spinal | Laterality: Left

## 2019-01-12 MED ORDER — MIDAZOLAM HCL 2 MG/2ML IJ SOLN
INTRAMUSCULAR | Status: DC | PRN
  Administered 2019-01-12 (×2): 1 mg via INTRAVENOUS

## 2019-01-12 MED ORDER — CEFAZOLIN SODIUM-DEXTROSE 2-4 GM/100ML-% IV SOLN
2.0000 g | INTRAVENOUS | Status: AC
  Administered 2019-01-12: 2 g via INTRAVENOUS
  Filled 2019-01-12: qty 100

## 2019-01-12 MED ORDER — METHOCARBAMOL 500 MG IVPB - SIMPLE MED
500.0000 mg | Freq: Four times a day (QID) | INTRAVENOUS | Status: DC | PRN
  Administered 2019-01-12: 500 mg via INTRAVENOUS
  Filled 2019-01-12: qty 50

## 2019-01-12 MED ORDER — PROMETHAZINE HCL 25 MG/ML IJ SOLN: 6.2500 mg | INTRAMUSCULAR | Status: DC | PRN

## 2019-01-12 MED ORDER — METHOCARBAMOL 500 MG IVPB - SIMPLE MED
INTRAVENOUS | Status: AC
  Filled 2019-01-12: qty 50

## 2019-01-12 MED ORDER — PROPOFOL 10 MG/ML IV BOLUS
INTRAVENOUS | Status: AC
  Filled 2019-01-12: qty 80

## 2019-01-12 MED ORDER — ACETAMINOPHEN 325 MG PO TABS: 325.0000 mg | ORAL_TABLET | Freq: Four times a day (QID) | ORAL | Status: DC | PRN

## 2019-01-12 MED ORDER — NON FORMULARY: 20.0000 mg | Freq: Every day | Status: DC

## 2019-01-12 MED ORDER — OXYCODONE HCL 5 MG/5ML PO SOLN: 5.0000 mg | Freq: Once | ORAL | Status: DC | PRN

## 2019-01-12 MED ORDER — OMEPRAZOLE 20 MG PO CPDR
20.0000 mg | DELAYED_RELEASE_CAPSULE | Freq: Every day | ORAL | Status: DC
  Administered 2019-01-12: 13:00:00 20 mg via ORAL
  Filled 2019-01-12: qty 1

## 2019-01-12 MED ORDER — EPHEDRINE SULFATE-NACL 50-0.9 MG/10ML-% IV SOSY
PREFILLED_SYRINGE | INTRAVENOUS | Status: DC | PRN
  Administered 2019-01-12 (×2): 5 mg via INTRAVENOUS
  Administered 2019-01-12: 10 mg via INTRAVENOUS
  Administered 2019-01-12 (×2): 5 mg via INTRAVENOUS

## 2019-01-12 MED ORDER — PANTOPRAZOLE SODIUM 40 MG PO TBEC: 40.0000 mg | DELAYED_RELEASE_TABLET | Freq: Every day | ORAL | Status: DC

## 2019-01-12 MED ORDER — DEXAMETHASONE SODIUM PHOSPHATE 10 MG/ML IJ SOLN: 10.0000 mg | Freq: Once | INTRAMUSCULAR | Status: DC

## 2019-01-12 MED ORDER — LIDOCAINE 2% (20 MG/ML) 5 ML SYRINGE
INTRAMUSCULAR | Status: DC | PRN
  Administered 2019-01-12: 60 mg via INTRAVENOUS

## 2019-01-12 MED ORDER — BUPIVACAINE IN DEXTROSE 0.75-8.25 % IT SOLN
INTRATHECAL | Status: DC | PRN
  Administered 2019-01-12: 1.8 mL via INTRATHECAL

## 2019-01-12 MED ORDER — MIDAZOLAM HCL 2 MG/2ML IJ SOLN
INTRAMUSCULAR | Status: AC
  Filled 2019-01-12: qty 2

## 2019-01-12 MED ORDER — FENTANYL CITRATE (PF) 100 MCG/2ML IJ SOLN
INTRAMUSCULAR | Status: AC
  Filled 2019-01-12: qty 2

## 2019-01-12 MED ORDER — DOCUSATE SODIUM 100 MG PO CAPS
100.0000 mg | ORAL_CAPSULE | Freq: Two times a day (BID) | ORAL | Status: DC
  Administered 2019-01-12 (×2): 100 mg via ORAL
  Filled 2019-01-12 (×2): qty 1

## 2019-01-12 MED ORDER — HYDROMORPHONE HCL 1 MG/ML IJ SOLN: 0.2500 mg | INTRAMUSCULAR | Status: DC | PRN

## 2019-01-12 MED ORDER — TRANEXAMIC ACID-NACL 1000-0.7 MG/100ML-% IV SOLN
1000.0000 mg | INTRAVENOUS | Status: AC
  Administered 2019-01-12: 1000 mg via INTRAVENOUS
  Filled 2019-01-12: qty 100

## 2019-01-12 MED ORDER — OXYCODONE HCL 5 MG PO TABS: 5.0000 mg | ORAL_TABLET | Freq: Once | ORAL | Status: DC | PRN

## 2019-01-12 MED ORDER — ROSUVASTATIN CALCIUM 10 MG PO TABS
10.0000 mg | ORAL_TABLET | Freq: Every day | ORAL | Status: DC
  Administered 2019-01-13: 10 mg via ORAL
  Filled 2019-01-12: qty 1

## 2019-01-12 MED ORDER — MENTHOL 3 MG MT LOZG: 1.0000 | LOZENGE | OROMUCOSAL | Status: DC | PRN

## 2019-01-12 MED ORDER — HYDROCODONE-ACETAMINOPHEN 7.5-325 MG PO TABS: 1.0000 | ORAL_TABLET | ORAL | Status: DC | PRN

## 2019-01-12 MED ORDER — CHLORHEXIDINE GLUCONATE 4 % EX LIQD: 60.0000 mL | Freq: Once | CUTANEOUS | Status: DC

## 2019-01-12 MED ORDER — METOCLOPRAMIDE HCL 5 MG PO TABS: 5.0000 mg | ORAL_TABLET | Freq: Three times a day (TID) | ORAL | Status: DC | PRN

## 2019-01-12 MED ORDER — ONDANSETRON HCL 4 MG/2ML IJ SOLN: 4.0000 mg | Freq: Four times a day (QID) | INTRAMUSCULAR | Status: DC | PRN

## 2019-01-12 MED ORDER — LACTATED RINGERS IV SOLN
INTRAVENOUS | Status: DC
  Administered 2019-01-12: 06:00:00 via INTRAVENOUS

## 2019-01-12 MED ORDER — FENTANYL CITRATE (PF) 100 MCG/2ML IJ SOLN
INTRAMUSCULAR | Status: DC | PRN
  Administered 2019-01-12: 50 ug via INTRAVENOUS

## 2019-01-12 MED ORDER — TEMAZEPAM 15 MG PO CAPS
15.0000 mg | ORAL_CAPSULE | Freq: Every day | ORAL | Status: DC
  Administered 2019-01-12: 21:00:00 15 mg via ORAL
  Filled 2019-01-12: qty 1

## 2019-01-12 MED ORDER — EPHEDRINE 5 MG/ML INJ
INTRAVENOUS | Status: AC
  Filled 2019-01-12: qty 10

## 2019-01-12 MED ORDER — PROPOFOL 500 MG/50ML IV EMUL
INTRAVENOUS | Status: DC | PRN
  Administered 2019-01-12: 75 ug/kg/min via INTRAVENOUS

## 2019-01-12 MED ORDER — METOCLOPRAMIDE HCL 5 MG/ML IJ SOLN: 5.0000 mg | Freq: Three times a day (TID) | INTRAMUSCULAR | Status: DC | PRN

## 2019-01-12 MED ORDER — DEXAMETHASONE SODIUM PHOSPHATE 10 MG/ML IJ SOLN
10.0000 mg | Freq: Once | INTRAMUSCULAR | Status: AC
  Administered 2019-01-12: 10 mg via INTRAVENOUS

## 2019-01-12 MED ORDER — POLYETHYLENE GLYCOL 3350 17 G PO PACK: 17.0000 g | PACK | Freq: Every day | ORAL | Status: DC | PRN

## 2019-01-12 MED ORDER — ALUM & MAG HYDROXIDE-SIMETH 200-200-20 MG/5ML PO SUSP: 30.0000 mL | ORAL | Status: DC | PRN

## 2019-01-12 MED ORDER — DEXAMETHASONE SODIUM PHOSPHATE 10 MG/ML IJ SOLN
INTRAMUSCULAR | Status: AC
  Filled 2019-01-12: qty 1

## 2019-01-12 MED ORDER — SODIUM CHLORIDE 0.9 % IV SOLN
INTRAVENOUS | Status: DC
  Administered 2019-01-12: 18:00:00 via INTRAVENOUS

## 2019-01-12 MED ORDER — PHENOL 1.4 % MT LIQD: 1.0000 | OROMUCOSAL | Status: DC | PRN

## 2019-01-12 MED ORDER — FERROUS SULFATE 325 (65 FE) MG PO TABS
325.0000 mg | ORAL_TABLET | Freq: Two times a day (BID) | ORAL | Status: DC
  Administered 2019-01-13: 325 mg via ORAL
  Filled 2019-01-12: qty 1

## 2019-01-12 MED ORDER — SODIUM CHLORIDE 0.9 % IR SOLN
Status: DC | PRN
  Administered 2019-01-12: 1000 mL

## 2019-01-12 MED ORDER — HYDROCODONE-ACETAMINOPHEN 5-325 MG PO TABS
1.0000 | ORAL_TABLET | ORAL | Status: DC | PRN
  Administered 2019-01-12 – 2019-01-13 (×5): 2 via ORAL
  Filled 2019-01-12 (×5): qty 2

## 2019-01-12 MED ORDER — CEFAZOLIN SODIUM-DEXTROSE 1-4 GM/50ML-% IV SOLN
1.0000 g | Freq: Four times a day (QID) | INTRAVENOUS | Status: AC
  Administered 2019-01-12 (×2): 1 g via INTRAVENOUS
  Filled 2019-01-12 (×2): qty 50

## 2019-01-12 MED ORDER — LIDOCAINE 2% (20 MG/ML) 5 ML SYRINGE
INTRAMUSCULAR | Status: AC
  Filled 2019-01-12: qty 5

## 2019-01-12 MED ORDER — BENZONATATE 100 MG PO CAPS: 100.0000 mg | ORAL_CAPSULE | Freq: Two times a day (BID) | ORAL | Status: DC | PRN

## 2019-01-12 MED ORDER — MORPHINE SULFATE (PF) 2 MG/ML IV SOLN: 0.5000 mg | INTRAVENOUS | Status: DC | PRN

## 2019-01-12 MED ORDER — ONDANSETRON HCL 4 MG/2ML IJ SOLN
INTRAMUSCULAR | Status: DC | PRN
  Administered 2019-01-12: 4 mg via INTRAVENOUS

## 2019-01-12 MED ORDER — ONDANSETRON HCL 4 MG PO TABS: 4.0000 mg | ORAL_TABLET | Freq: Four times a day (QID) | ORAL | Status: DC | PRN

## 2019-01-12 MED ORDER — ASPIRIN 81 MG PO CHEW
81.0000 mg | CHEWABLE_TABLET | Freq: Two times a day (BID) | ORAL | Status: DC
  Administered 2019-01-12: 21:00:00 81 mg via ORAL
  Filled 2019-01-12: qty 1

## 2019-01-12 MED ORDER — METHOCARBAMOL 500 MG PO TABS
500.0000 mg | ORAL_TABLET | Freq: Four times a day (QID) | ORAL | Status: DC | PRN
  Administered 2019-01-12: 500 mg via ORAL
  Filled 2019-01-12: qty 1

## 2019-01-12 MED ORDER — ONDANSETRON HCL 4 MG/2ML IJ SOLN
INTRAMUSCULAR | Status: AC
  Filled 2019-01-12: qty 2

## 2019-01-12 SURGICAL SUPPLY — 46 items
BAG DECANTER FOR FLEXI CONT (MISCELLANEOUS) IMPLANT
BAG ZIPLOCK 12X15 (MISCELLANEOUS) IMPLANT
BLADE SAG 18X100X1.27 (BLADE) ×2 IMPLANT
BLADE SURG SZ10 CARB STEEL (BLADE) ×4 IMPLANT
COVER PERINEAL POST (MISCELLANEOUS) ×2 IMPLANT
COVER SURGICAL LIGHT HANDLE (MISCELLANEOUS) ×2 IMPLANT
COVER WAND RF STERILE (DRAPES) IMPLANT
CUP ACETBLR 54 OD PINNACLE (Hips) ×1 IMPLANT
DERMABOND ADVANCED (GAUZE/BANDAGES/DRESSINGS) ×1
DERMABOND ADVANCED .7 DNX12 (GAUZE/BANDAGES/DRESSINGS) ×1 IMPLANT
DRAPE STERI IOBAN 125X83 (DRAPES) ×2 IMPLANT
DRAPE U-SHAPE 47X51 STRL (DRAPES) ×4 IMPLANT
DRESSING AQUACEL AG SP 3.5X10 (GAUZE/BANDAGES/DRESSINGS) ×1 IMPLANT
DRSG AQUACEL AG SP 3.5X10 (GAUZE/BANDAGES/DRESSINGS) ×2
DURAPREP 26ML APPLICATOR (WOUND CARE) ×2 IMPLANT
ELECT BLADE TIP CTD 4 INCH (ELECTRODE) ×2 IMPLANT
ELECT REM PT RETURN 15FT ADLT (MISCELLANEOUS) ×2 IMPLANT
ELIMINATOR HOLE APEX DEPUY (Hips) ×1 IMPLANT
GLOVE BIO SURGEON STRL SZ 6 (GLOVE) ×4 IMPLANT
GLOVE BIOGEL PI IND STRL 6.5 (GLOVE) ×1 IMPLANT
GLOVE BIOGEL PI IND STRL 7.5 (GLOVE) ×1 IMPLANT
GLOVE BIOGEL PI IND STRL 8.5 (GLOVE) ×1 IMPLANT
GLOVE BIOGEL PI INDICATOR 6.5 (GLOVE) ×1
GLOVE BIOGEL PI INDICATOR 7.5 (GLOVE) ×1
GLOVE BIOGEL PI INDICATOR 8.5 (GLOVE) ×1
GLOVE ECLIPSE 8.0 STRL XLNG CF (GLOVE) ×4 IMPLANT
GLOVE ORTHO TXT STRL SZ7.5 (GLOVE) ×4 IMPLANT
GOWN STRL REUS W/TWL LRG LVL3 (GOWN DISPOSABLE) ×4 IMPLANT
GOWN STRL REUS W/TWL XL LVL3 (GOWN DISPOSABLE) ×2 IMPLANT
HEAD M SROM 36MM PLUS 1.5 (Hips) IMPLANT
HOLDER FOLEY CATH W/STRAP (MISCELLANEOUS) ×2 IMPLANT
KIT TURNOVER KIT A (KITS) IMPLANT
LINER NEUTRAL 54X36MM PLUS 4 (Hips) ×1 IMPLANT
PACK ANTERIOR HIP CUSTOM (KITS) ×2 IMPLANT
SCREW 6.5MMX25MM (Screw) ×1 IMPLANT
SROM M HEAD 36MM PLUS 1.5 (Hips) ×2 IMPLANT
STEM FEM ACTIS HIGH SZ8 (Stem) ×1 IMPLANT
SUT MNCRL AB 4-0 PS2 18 (SUTURE) ×2 IMPLANT
SUT STRATAFIX 0 PDS 27 VIOLET (SUTURE) ×2
SUT VIC AB 1 CT1 36 (SUTURE) ×6 IMPLANT
SUT VIC AB 2-0 CT1 27 (SUTURE) ×2
SUT VIC AB 2-0 CT1 TAPERPNT 27 (SUTURE) ×2 IMPLANT
SUTURE STRATFX 0 PDS 27 VIOLET (SUTURE) ×1 IMPLANT
TRAY FOLEY MTR SLVR 16FR STAT (SET/KITS/TRAYS/PACK) ×1 IMPLANT
WATER STERILE IRR 1000ML POUR (IV SOLUTION) ×2 IMPLANT
YANKAUER SUCT BULB TIP 10FT TU (MISCELLANEOUS) IMPLANT

## 2019-01-12 NOTE — Op Note (Signed)
NAME:  Steven Weeks NO.: [id number]      MEDICAL RECORD NO.: [id number]      FACILITY:  [facility]      PHYSICIAN:  [name]  DATE OF BIRTH:  [birth year]     DATE OF PROCEDURE:  01/12/2019                                 OPERATIVE REPORT         PREOPERATIVE DIAGNOSIS: Left  hip osteoarthritis.      POSTOPERATIVE DIAGNOSIS:  Left hip osteoarthritis.      PROCEDURE:  Left total hip replacement through an anterior approach   utilizing DePuy THR system, component size 106mm pinnacle cup, a size 36+4 neutral   Altrex liner, a size 8 Hi Actis stem with a 36+1.5 Articuleze metal head ball.      SURGEON:  Pietro Cassis. Alvan Dame, M.D.      ASSISTANT:  Danae Orleans, PA-C     ANESTHESIA:  Spinal.      SPECIMENS:  None.      COMPLICATIONS:  None.      BLOOD LOSS:  350 cc     DRAINS:  None.      INDICATION OF THE PROCEDURE:  Steven Weeks is a 78 y.o. male who had   presented to office for evaluation of left hip pain.  Radiographs revealed   progressive degenerative changes with bone-on-bone   articulation of the  hip joint, including subchondral cystic changes and osteophytes.  The patient had painful limited range of   motion significantly affecting their overall quality of life and function.  The patient was failing to    respond to conservative measures including medications and/or injections and activity modification and at this point was ready   to proceed with more definitive measures.  Consent was obtained for   benefit of pain relief.  Specific risks of infection, DVT, component   failure, dislocation, neurovascular injury, and need for revision surgery were reviewed in the office as well discussion of   the anterior versus posterior approach were reviewed.     PROCEDURE IN DETAIL:  The patient was brought to operative theater.   Once adequate anesthesia, preoperative antibiotics, 2 gm of Ancef, 1 gm of Tranexamic Acid, and 10 mg  of Decadron were administered, the patient was positioned supine on the [employer] table.  Once the patient was safely positioned with adequate padding of boney prominences we predraped out the hip, and used fluoroscopy to confirm orientation of the pelvis.      The left hip was then prepped and draped from proximal iliac crest to   mid thigh with a shower curtain technique.      Time-out was performed identifying the patient, planned procedure, and the appropriate extremity.     An incision was then made 2 cm lateral to the   anterior superior iliac spine extending over the orientation of the   tensor fascia lata muscle and sharp dissection was carried down to the   fascia of the muscle.      The fascia was then incised.  The muscle belly was identified and swept   laterally and retractor placed along the superior neck.  Following   cauterization of the circumflex vessels and removing some pericapsular  fat, a second cobra retractor was placed on the inferior neck.  A T-capsulotomy was made along the line of the   superior neck to the trochanteric fossa, then extended proximally and   distally.  Tag sutures were placed and the retractors were then placed   intracapsular.  We then identified the trochanteric fossa and   orientation of my neck cut and then made a neck osteotomy with the femur on traction.  The femoral   head was removed without difficulty or complication.  Traction was let   off and retractors were placed posterior and anterior around the   acetabulum.      The labrum and foveal tissue were debrided.  I began reaming with a 48 mm   reamer and reamed up to 53 mm reamer with good bony bed preparation and a 54 mm  cup was chosen.  The final 54 mm Pinnacle cup was then impacted under fluoroscopy to confirm the depth of penetration and orientation with respect to   Abduction and forward flexion.  A screw was placed into the ilium followed by the hole eliminator.  The final    36+4 neutral Altrex liner was impacted with good visualized rim fit.  The cup was positioned anatomically within the acetabular portion of the pelvis.      At this point, the femur was rolled to 100 degrees.  Further capsule was   released off the inferior aspect of the femoral neck.  I then   released the superior capsule proximally.  With the leg in a neutral position the hook was placed laterally   along the femur under the vastus lateralis origin and elevated manually and then held in position using the hook attachment on the bed.  The leg was then extended and adducted with the leg rolled to 100   degrees of external rotation.  Retractors were placed along the medial calcar and posteriorly over the greater trochanter.  Once the proximal femur was fully   exposed, I used a box osteotome to set orientation.  I then began   broaching with the starting chili pepper broach and passed this by hand and then broached up to 8.  With the 8 broach in place I chose a high offset neck and did several trial reductions.  The offset was appropriate, leg lengths   appeared to be equal best matched with the +1.5 head ball trial confirmed radiographically.   Given these findings, I went ahead and dislocated the hip, repositioned all   retractors and positioned the right hip in the extended and abducted position.  The final 8 Hi Actis stem was   chosen and it was impacted down to the level of neck cut.  Based on this   and the trial reductions, a final 36+1.5 Articuleze metal ball was chosen and   impacted onto a clean and dry trunnion, and the hip was reduced.  The   hip had been irrigated throughout the case again at this point.  I did   reapproximate the superior capsular leaflet to the anterior leaflet   using #1 Vicryl.  The fascia of the   tensor fascia lata muscle was then reapproximated using #1 Vicryl and #0 Stratafix sutures.  The   remaining wound was closed with 2-0 Vicryl and running 4-0 Monocryl.    The hip was cleaned, dried, and dressed sterilely using Dermabond and   Aquacel dressing.  The patient was then brought   to recovery room in  stable condition tolerating the procedure well.    Danae Orleans, PA-C was present for the entirety of the case involved from   preoperative positioning, perioperative retractor management, general   facilitation of the case, as well as primary wound closure as assistant.            Pietro Cassis Alvan Dame, M.D.        01/12/2019 8:51 AM

## 2019-01-12 NOTE — Transfer of Care (Signed)
Immediate Anesthesia Transfer of Care Note  Patient: Steven Weeks  Procedure(s) Performed: TOTAL HIP ARTHROPLASTY ANTERIOR APPROACH (Left )  Patient Location: PACU  Anesthesia Type:Spinal  Level of Consciousness: awake, alert  and oriented  Airway & Oxygen Therapy: Patient Spontanous Breathing and Patient connected to face mask oxygen  Post-op Assessment: Report given to RN and Post -op Vital signs reviewed and stable  Post vital signs: Reviewed and stable  Last Vitals:  Vitals Value Taken Time  BP 97/59 01/12/19 0913  Temp    Pulse 83 01/12/19 0914  Resp 11 01/12/19 0914  SpO2 100 % 01/12/19 0914  Vitals shown include unvalidated device data.  Last Pain:  Vitals:   01/12/19 0540  TempSrc: Oral         Complications: No apparent anesthesia complications

## 2019-01-12 NOTE — Evaluation (Signed)
Physical Therapy Evaluation Patient Details Name: Steven Weeks MRN: [id number] DOB: 1941-02-05 Today's Date: 01/12/2019   History of Present Illness  78 y/o male s/p L DA-THA on 01/12/19. PMH includes R shoulder replacement, R TKR, prostate cancer, colon cancer with history of chemo and radiation, falls.  Clinical Impression   Pt presents with L hip pain, decreased L hip strength, increased time and effort to perform mobility tasks, and decreased activity tolerance. Pt to benefit from acute PT to address deficits. Pt ambulated 100 ft with RW with min guard assist, verbal cuing for sequencing and form provided throughout. Pt educated on ankle pumps (20/hour) to perform this afternoon/evening to increase circulation, to pt's tolerance and limited by pain. PT to progress mobility as tolerated, and will continue to follow acutely.        Follow Up Recommendations Follow surgeon's recommendation for DC plan and follow-up therapies;Supervision for mobility/OOB    Equipment Recommendations  None recommended by PT    Recommendations for Other Services       Precautions / Restrictions Precautions Precautions: Fall Restrictions Weight Bearing Restrictions: No Other Position/Activity Restrictions: WBAT      Mobility  Bed Mobility Overal bed mobility: Needs Assistance Bed Mobility: Supine to Sit     Supine to sit: Min guard;HOB elevated     General bed mobility comments: Min guard for safety, verbal cuing for sequencing. Increased time and effort.  Transfers Overall transfer level: Needs assistance Equipment used: Rolling walker (2 wheeled) Transfers: Sit to/from Stand Sit to Stand: Min guard;From elevated surface         General transfer comment: Min guard for safety, verbal cuing for hand placement when rising.  Ambulation/Gait Ambulation/Gait assistance: Min guard Gait Distance (Feet): 100 Feet Assistive device: Rolling walker (2 wheeled) Gait Pattern/deviations:  Step-to pattern;Step-through pattern;Decreased stride length;Antalgic;Decreased weight shift to left;Decreased stance time - left Gait velocity: decr   General Gait Details: Min guard for safety, verbal cuing for placement in RW, sequencing, turning with Rw.  Stairs            Wheelchair Mobility    Modified Rankin (Stroke Patients Only)       Balance Overall balance assessment: Mild deficits observed, not formally tested                                           Pertinent Vitals/Pain Pain Assessment: 0-10 Pain Score: 5  Pain Location: L hip Pain Descriptors / Indicators: Sore Pain Intervention(s): Limited activity within patient's tolerance;Monitored during session;Premedicated before session;Repositioned;Ice applied    Home Living Family/patient expects to be discharged to:: Private residence Living Arrangements: Spouse/significant other Available Help at Discharge: Family;Available 24 hours/day Type of Home: House Home Access: Stairs to enter   [employer] of Steps: 1 Home Layout: Able to live on main level with bedroom/bathroom Home Equipment: Walker - 2 wheels;Grab bars - tub/shower      Prior Function Level of Independence: Independent         Comments: pt enjoys playing and watching golf     Hand Dominance   Dominant Hand: Left    Extremity/Trunk Assessment   Upper Extremity Assessment Upper Extremity Assessment: Overall WFL for tasks assessed    Lower Extremity Assessment Lower Extremity Assessment: Overall WFL for tasks assessed;LLE deficits/detail LLE Deficits / Details: suspected post-surgical hip weakness; able to perform ankle pumps, quad set, heel  slide, leg lift LLE Sensation: WNL    Cervical / Trunk Assessment Cervical / Trunk Assessment: Normal  Communication   Communication: HOH  Cognition Arousal/Alertness: Awake/alert Behavior During Therapy: WFL for tasks assessed/performed Overall Cognitive  Status: Within Functional Limits for tasks assessed                                        General Comments      Exercises     Assessment/Plan    PT Assessment Patient needs continued PT services  PT Problem List Decreased strength;Decreased mobility;Decreased range of motion;Decreased activity tolerance;Decreased balance;Decreased knowledge of use of DME;Pain       PT Treatment Interventions DME instruction;Therapeutic activities;Gait training;Therapeutic exercise;Patient/family education;Balance training;Stair training;Functional mobility training    PT Goals (Current goals can be found in the Care Plan section)  Acute Rehab PT Goals PT Goal Formulation: With patient Time For Goal Achievement: 01/19/19 Potential to Achieve Goals: Good    Frequency 7X/week   Barriers to discharge        Co-evaluation               AM-PAC PT "6 Clicks" Mobility  Outcome Measure Help needed turning from your back to your side while in a flat bed without using bedrails?: A Little Help needed moving from lying on your back to sitting on the side of a flat bed without using bedrails?: A Little Help needed moving to and from a bed to a chair (including a wheelchair)?: A Little Help needed standing up from a chair using your arms (e.g., wheelchair or bedside chair)?: A Little Help needed to walk in hospital room?: A Little Help needed climbing 3-5 steps with a railing? : A Little 6 Click Score: 18    End of Session Equipment Utilized During Treatment: Gait belt Activity Tolerance: Patient tolerated treatment well Patient left: with chair alarm set;in chair;with call bell/phone within reach;with SCD's reapplied Nurse Communication: Mobility status PT Visit Diagnosis: Other abnormalities of gait and mobility (R26.89);Difficulty in walking, not elsewhere classified (R26.2)    Time: 1534-1550 PT Time Calculation (min) (ACUTE ONLY): 16 min   Charges:   PT  Evaluation $PT Eval Low Complexity: 1 Low          Jessey Stehlin Conception Chancy, PT Acute Rehabilitation Services Pager [phone]  Office [phone]   Daelynn Blower D Elonda Husky 01/12/2019, 3:58 PM

## 2019-01-12 NOTE — Discharge Instructions (Signed)

## 2019-01-12 NOTE — Anesthesia Postprocedure Evaluation (Signed)
Anesthesia Post Note  Patient: Steven Weeks  Procedure(s) Performed: TOTAL HIP ARTHROPLASTY ANTERIOR APPROACH (Left )     Patient location during evaluation: PACU Anesthesia Type: Spinal Level of consciousness: oriented and awake and alert Pain management: pain level controlled Vital Signs Assessment: post-procedure vital signs reviewed and stable Respiratory status: spontaneous breathing and respiratory function stable Cardiovascular status: blood pressure returned to baseline and stable Postop Assessment: no headache, no backache and no apparent nausea or vomiting Anesthetic complications: no    Last Vitals:  Vitals:   01/12/19 1000 01/12/19 1015  BP: 126/66 120/64  Pulse: 61 (!) 54  Resp: 15 14  Temp:    SpO2: 100% 100%    Last Pain:  Vitals:   01/12/19 1015  TempSrc:   PainSc: 0-No pain                 Lynda Rainwater

## 2019-01-12 NOTE — Interval H&P Note (Signed)
History and Physical Interval Note:  01/12/2019 6:56 AM  Steven Weeks  has presented today for surgery, with the diagnosis of Left hip osteoarthritis.  The various methods of treatment have been discussed with the patient and family. After consideration of risks, benefits and other options for treatment, the patient has consented to  Procedure(s) with comments: TOTAL HIP ARTHROPLASTY ANTERIOR APPROACH (Left) - 70 mins as a surgical intervention.  The patient's history has been reviewed, patient examined, no change in status, stable for surgery.  I have reviewed the patient's chart and labs.  Questions were answered to the patient's satisfaction.     [name]

## 2019-01-12 NOTE — Anesthesia Procedure Notes (Signed)
Procedure Name: MAC Date/Time: 01/12/2019 7:28 AM Performed by: Eben Burow, CRNA Pre-anesthesia Checklist: Patient identified, Emergency Drugs available, Suction available, Patient being monitored and Timeout performed Oxygen Delivery Method: Simple face mask Dental Injury: Teeth and Oropharynx as per pre-operative assessment

## 2019-01-12 NOTE — Addendum Note (Signed)
Addendum  created 01/12/19 1043 by Eben Burow, CRNA   Charge Capture section accepted, Visit diagnoses modified

## 2019-01-12 NOTE — Anesthesia Procedure Notes (Signed)
Spinal  Patient location during procedure: OR Start time: 01/12/2019 7:29 AM End time: 01/12/2019 7:33 AM Staffing Anesthesiologist: Lynda Rainwater, MD Resident/CRNA: Eben Burow, CRNA Performed: resident/CRNA  Preanesthetic Checklist Completed: patient identified, site marked, surgical consent, pre-op evaluation, timeout performed, IV checked, risks and benefits discussed and monitors and equipment checked Spinal Block Patient position: sitting Prep: site prepped and draped and DuraPrep Patient monitoring: heart rate, cardiac monitor, continuous pulse ox and blood pressure Approach: midline Location: L2-3 Injection technique: single-shot Needle Needle type: Quincke  Needle gauge: 22 G Needle length: 9 cm Assessment Sensory level: T4 Additional Notes Pt placed in sitting position, SAB kit expiration date checked and verified, one attempt by CRNA, positive CSF, negative heme, pt tolerated well.

## 2019-01-12 NOTE — Anesthesia Preprocedure Evaluation (Signed)
Anesthesia Evaluation  Patient identified by MRN, date of birth, ID band Patient awake    Reviewed: Allergy & Precautions, H&P , NPO status , Patient's Chart, lab work & pertinent test results  Airway Mallampati: II  TM Distance: >3 FB Neck ROM: Full    Dental no notable dental hx. (+) Dental Advisory Given, Teeth Intact   Pulmonary neg pulmonary ROS, former smoker,    Pulmonary exam normal breath sounds clear to auscultation       Cardiovascular Exercise Tolerance: Good negative cardio ROS Normal cardiovascular exam Rhythm:Regular Rate:Normal     Neuro/Psych negative neurological ROS  negative psych ROS   GI/Hepatic Neg liver ROS, hiatal hernia, GERD  Medicated and Controlled,Colon Ca   Endo/Other  negative endocrine ROS  Renal/GU negative Renal ROS  negative genitourinary   Musculoskeletal negative musculoskeletal ROS (+)   Abdominal   Peds negative pediatric ROS (+)  Hematology negative hematology ROS (+)   Anesthesia Other Findings Prostate Ca  Reproductive/Obstetrics negative OB ROS                             Anesthesia Physical  Anesthesia Plan  ASA: II  Anesthesia Plan: Spinal   Post-op Pain Management:    Induction:   PONV Risk Score and Plan: 1 and Ondansetron and Treatment may vary due to age or medical condition  Airway Management Planned: Simple Face Mask  Additional Equipment:   Intra-op Plan:   Post-operative Plan:   Informed Consent: I have reviewed the patients History and Physical, chart, labs and discussed the procedure including the risks, benefits and alternatives for the proposed anesthesia with the patient or authorized representative who has indicated his/her understanding and acceptance.     Dental advisory given  Plan Discussed with: CRNA and Surgeon  Anesthesia Plan Comments:         Anesthesia Quick Evaluation

## 2019-01-12 NOTE — Anesthesia Procedure Notes (Signed)
Anesthesia Procedure Note     

## 2019-01-13 ENCOUNTER — Encounter (HOSPITAL_COMMUNITY): Payer: Self-pay | Admitting: Orthopedic Surgery

## 2019-01-13 DIAGNOSIS — E663 Overweight: Secondary | ICD-10-CM | POA: Diagnosis present

## 2019-01-13 LAB — CBC
HCT: 34 % — ABNORMAL LOW (ref 39.0–52.0)
Hemoglobin: 11.1 g/dL — ABNORMAL LOW (ref 13.0–17.0)
MCH: 31.6 pg (ref 26.0–34.0)
MCHC: 32.6 g/dL (ref 30.0–36.0)
MCV: 96.9 fL (ref 80.0–100.0)
Platelets: 157 10*3/uL (ref 150–400)
RBC: 3.51 MIL/uL — ABNORMAL LOW (ref 4.22–5.81)
RDW: 11.9 % (ref 11.5–15.5)
WBC: 8.1 10*3/uL (ref 4.0–10.5)
nRBC: 0 % (ref 0.0–0.2)

## 2019-01-13 LAB — BASIC METABOLIC PANEL
Anion gap: 6 (ref 5–15)
BUN: 19 mg/dL (ref 8–23)
CO2: 24 mmol/L (ref 22–32)
Calcium: 8.2 mg/dL — ABNORMAL LOW (ref 8.9–10.3)
Chloride: 107 mmol/L (ref 98–111)
Creatinine, Ser: 1.11 mg/dL (ref 0.61–1.24)
GFR calc Af Amer: >60 mL/min (ref >60)
GFR calc non Af Amer: >60 mL/min (ref >60)
Glucose, Bld: 123 mg/dL — ABNORMAL HIGH (ref 70–99)
Potassium: 4.5 mmol/L (ref 3.5–5.1)
Sodium: 137 mmol/L (ref 135–145)

## 2019-01-13 MED ORDER — HYDROCODONE-ACETAMINOPHEN 5-325 MG PO TABS: 1.0000 | ORAL_TABLET | ORAL | 0 refills | Status: AC | PRN

## 2019-01-13 MED ORDER — DOCUSATE SODIUM 100 MG PO CAPS: 100.0000 mg | ORAL_CAPSULE | Freq: Two times a day (BID) | ORAL | 0 refills | Status: AC

## 2019-01-13 MED ORDER — FERROUS SULFATE 325 (65 FE) MG PO TABS: 325.0000 mg | ORAL_TABLET | Freq: Three times a day (TID) | ORAL | 0 refills | Status: AC

## 2019-01-13 MED ORDER — ASPIRIN 81 MG PO CHEW: 81.0000 mg | CHEWABLE_TABLET | Freq: Two times a day (BID) | ORAL | 0 refills | Status: AC

## 2019-01-13 MED ORDER — METHOCARBAMOL 500 MG PO TABS: 500.0000 mg | ORAL_TABLET | Freq: Four times a day (QID) | ORAL | 0 refills | Status: AC | PRN

## 2019-01-13 MED ORDER — POLYETHYLENE GLYCOL 3350 17 G PO PACK: 17.0000 g | PACK | Freq: Two times a day (BID) | ORAL | 0 refills | Status: AC

## 2019-01-13 NOTE — Progress Notes (Signed)
     Subjective: 1 Day Post-Op Procedure(s) (LRB): TOTAL HIP ARTHROPLASTY ANTERIOR APPROACH (Left)   Patient reports pain as mild, pain well controlled with medication.  No events reported events throughout the night.  Dr. Alvan Dame discussed the procedure, findings and expectations moving forward.  Patient is ready to be discharged home.     Objective:   VITALS:   Vitals:   01/13/19 0214 01/13/19 0601  BP: 124/67 127/70  Pulse: (!) 54 66  Resp: 18 18  Temp: 97.7 F (36.5 C) 97.7 F (36.5 C)  SpO2: 100% 100%    Dorsiflexion/Plantar flexion intact Incision: dressing C/D/I No cellulitis present Compartment soft  LABS Recent Labs    01/13/19 0227  HGB 11.1*  HCT 34.0*  WBC 8.1  PLT 157    Recent Labs    01/13/19 0227  NA 137  K 4.5  BUN 19  CREATININE 1.11  GLUCOSE 123*     Assessment/Plan: 1 Day Post-Op Procedure(s) (LRB): TOTAL HIP ARTHROPLASTY ANTERIOR APPROACH (Left) Foley cath DC'd Advance diet Up with therapy D/C IV fluids Discharge home Follow up in 2 weeks at [facility] ([place]). Follow up with OLIN,Clorissa Gruenberg D in 2 weeks.  Contact information:  [facility]) [address], [place] [id number]      Overweight (BMI 25-29.9) Estimated body mass index is 26.52 kg/m as calculated from the following:   Height as of this encounter: 5' 10.98" (1.803 m).   Weight as of this encounter: 86.2 kg. Patient also counseled that weight may inhibit the healing process Patient counseled that losing weight will help with future health issues      West Pugh. Marykathleen Russi   PAC  01/13/2019, 7:53 AM

## 2019-01-13 NOTE — Progress Notes (Signed)
Physical Therapy Treatment Patient Details Name: Steven Weeks MRN: [id number] DOB: 10-20-1940 Today's Date: 01/13/2019    History of Present Illness 78 y/o male s/p L DA-THA on 01/12/19. PMH includes R shoulder replacement, R TKR, prostate cancer, colon cancer with history of chemo and radiation, falls.    PT Comments    Pt progressing well with mobility and eager for dc home.  Pt reviewed car transfers, stairs and home therex program with written instruction provided.   Follow Up Recommendations  Follow surgeon's recommendation for DC plan and follow-up therapies;Supervision for mobility/OOB     Equipment Recommendations  None recommended by PT    Recommendations for Other Services       Precautions / Restrictions Precautions Precautions: Fall Restrictions Weight Bearing Restrictions: No Other Position/Activity Restrictions: WBAT    Mobility  Bed Mobility Overal bed mobility: Needs Assistance Bed Mobility: Supine to Sit;Sit to Supine     Supine to sit: Supervision Sit to supine: Supervision   General bed mobility comments: VC for technique  Transfers Overall transfer level: Needs assistance Equipment used: Rolling walker (2 wheeled) Transfers: Sit to/from Stand Sit to Stand: Supervision         General transfer comment: cues for LE management and use of UEs to self assist  Ambulation/Gait Ambulation/Gait assistance: Min guard;Supervision Gait Distance (Feet): 200 Feet Assistive device: Rolling walker (2 wheeled) Gait Pattern/deviations: Step-to pattern;Step-through pattern;Decreased step length - right;Decreased step length - left;Shuffle;Narrow base of support;Antalgic Gait velocity: decr   General Gait Details: cues for posture, position from RW, to increase BOS and initial sequence   Stairs Stairs: Yes Stairs assistance: Min assist Stair Management: No rails;Step to pattern;Forwards;With walker Number of Stairs: 2 General stair comments: single  step twice with RW; cues for sequence and foot/RW placement   Wheelchair Mobility    Modified Rankin (Stroke Patients Only)       Balance                                            Cognition Arousal/Alertness: Awake/alert Behavior During Therapy: WFL for tasks assessed/performed Overall Cognitive Status: Within Functional Limits for tasks assessed                                        Exercises Total Joint Exercises Ankle Circles/Pumps: AROM;Both;20 reps;Supine Quad Sets: AROM;Both;10 reps;Supine Heel Slides: AAROM;Left;20 reps;Supine Hip ABduction/ADduction: AAROM;Left;15 reps;Supine Long Arc Quad: AROM;Left;10 reps;Seated    General Comments        Pertinent Vitals/Pain Pain Assessment: 0-10 Pain Score: 3  Pain Location: L hip Pain Descriptors / Indicators: Sore Pain Intervention(s): Limited activity within patient's tolerance;Monitored during session;Premedicated before session    Home Living                      Prior Function            PT Goals (current goals can now be found in the care plan section) Acute Rehab PT Goals Patient Stated Goal: get back to golfing PT Goal Formulation: With patient Time For Goal Achievement: 01/19/19 Potential to Achieve Goals: Good Progress towards PT goals: Progressing toward goals    Frequency    7X/week      PT Plan Current plan remains appropriate  Co-evaluation              AM-PAC PT "6 Clicks" Mobility   Outcome Measure  Help needed turning from your back to your side while in a flat bed without using bedrails?: A Little Help needed moving from lying on your back to sitting on the side of a flat bed without using bedrails?: A Little Help needed moving to and from a bed to a chair (including a wheelchair)?: A Little Help needed standing up from a chair using your arms (e.g., wheelchair or bedside chair)?: A Little Help needed to walk in hospital  room?: A Little Help needed climbing 3-5 steps with a railing? : A Little 6 Click Score: 18    End of Session Equipment Utilized During Treatment: Gait belt Activity Tolerance: Patient tolerated treatment well Patient left: in chair;with call bell/phone within reach Nurse Communication: Mobility status PT Visit Diagnosis: Other abnormalities of gait and mobility (R26.89);Difficulty in walking, not elsewhere classified (R26.2)     Time: [id number] PT Time Calculation (min) (ACUTE ONLY): 61 min  Charges:  $Gait Training: 8-22 mins $Therapeutic Exercise: 23-37 mins $Therapeutic Activity: 8-22 mins                     Debe Coder PT Acute Rehabilitation Services Pager [phone] Office [phone]    [name] 01/13/2019, 10:43 AM

## 2019-01-16 NOTE — Discharge Summary (Signed)
Physician Discharge Summary  Patient ID: Steven Weeks MRN: [id number] DOB/AGE: 80/15/1942 80 y.o.  Admit date: 01/12/2019 Discharge date: 01/13/2019   Procedures:  Procedure(s) (LRB): TOTAL HIP ARTHROPLASTY ANTERIOR APPROACH (Left)  Attending Physician:  Dr. Paralee Cancel   Admission Diagnoses:   Left hip primary OA / pain  Discharge Diagnoses:  Principal Problem:   S/P left THA, AA Active Problems:   Overweight (BMI 25.0-29.9)  Past Medical History:  Diagnosis Date  . Allergic rhinitis   . Cataracts, bilateral   . Colon cancer ([place]) 1992  . Diverticulosis    never been told  . ED (erectile dysfunction)   . Falls    none recent  . Frequency of urination   . GERD (gastroesophageal reflux disease)   . Hearing loss    more per left ear  . History of chemotherapy   . History of ear infections as a child   . History of external beam radiation therapy    prostate--  07-17-2014 to 08-20-2014--  43y  . History of hiatal hernia   . History of malignant neoplasm of colon    1994--- S/P  HEMICOLECTOMY--  CHEMOTHERAPY-- no recurrence  . History of perforated ear drum    left ear   . Hyperlipidemia   . Infection of mastoid bone    left ear  . Insomnia   . Multiple lipomas   . Nocturia   . OA (osteoarthritis)   . Olecranon bursitis   . Prostate cancer ([place]) 2016   Stage T2a,  Gleason 4+4,  PSA 7.645,  vol. 56.82cc--  43y external radiation 07-17-2014 to 08-20-2014  . Shoulder injury    right / secondary to fall   . Torn meniscus    right knee approx 12 years ago     HPI:    Steven Weeks, 78 y.o. male, has a history of pain and functional disability in the left hip(s) due to arthritis and patient has failed non-surgical conservative treatments for greater than 12 weeks to include NSAID's and/or analgesics, corticosteriod injections and activity modification.  Onset of symptoms was gradual starting 2+ years ago with gradually worsening course since that time.The patient  noted no past surgery on the left hip(s).  Patient currently rates pain in the left hip at 9 out of 10 with activity. Patient has night pain, worsening of pain with activity and weight bearing, trendelenberg gait, pain that interfers with activities of daily living and pain with passive range of motion. Patient has evidence of periarticular osteophytes and joint space narrowing by imaging studies. This condition presents safety issues increasing the risk of falls.  There is no current active infection.  Risks, benefits and expectations were discussed with the patient.  Risks including but not limited to the risk of anesthesia, blood clots, nerve damage, blood vessel damage, failure of the prosthesis, infection and up to and including death.  Patient understand the risks, benefits and expectations and wishes to proceed with surgery.   PCP: Prince Solian, MD   Discharged Condition: good  Hospital Course:  Patient underwent the above stated procedure on 01/12/2019. Patient tolerated the procedure well and brought to the recovery room in good condition and subsequently to the floor.  POD #1 BP: 127/70 ; Pulse: 66 ; Temp: 97.7 F (36.5 C) ; Resp: 18 Patient reports pain as mild, pain well controlled with medication.  No events reported events throughout the night.  Dr. Alvan Dame discussed the procedure, findings and expectations moving forward.  Patient is ready to be discharged home. Dorsiflexion/plantar flexion intact, incision: dressing C/D/I, no cellulitis present and compartment soft.   LABS  Basename    HGB     11.1  HCT     34.0    Discharge Exam: General appearance: alert, cooperative and no distress Extremities: Homans sign is negative, no sign of DVT, no edema, redness or tenderness in the calves or thighs and no ulcers, gangrene or trophic changes  Disposition:  Home with follow up in 2 weeks   Follow-up Information    Paralee Cancel, MD. Schedule an appointment as soon as possible for  a visit in 2 weeks.   Specialty: Orthopedic Surgery Contact information: [address] [place] [address] [id number]           Discharge Instructions    Call MD / Call 911   Complete by: As directed    If you experience chest pain or shortness of breath, CALL 911 and be transported to the hospital emergency room.  If you develope a fever above 101 F, pus (white drainage) or increased drainage or redness at the wound, or calf pain, call your surgeon's office.   Change dressing   Complete by: As directed    Maintain surgical dressing until follow up in the clinic. If the edges start to pull up, may reinforce with tape. If the dressing is no longer working, may remove and cover with gauze and tape, but must keep the area dry and clean.  Call with any questions or concerns.   Constipation Prevention   Complete by: As directed    Drink plenty of fluids.  Prune juice may be helpful.  You may use a stool softener, such as Colace (over the counter) 100 mg twice a day.  Use MiraLax (over the counter) for constipation as needed.   Diet - low sodium heart healthy   Complete by: As directed    Discharge instructions   Complete by: As directed    Maintain surgical dressing until follow up in the clinic. If the edges start to pull up, may reinforce with tape. If the dressing is no longer working, may remove and cover with gauze and tape, but must keep the area dry and clean.  Follow up in 2 weeks at [facility]. Call with any questions or concerns.   Increase activity slowly as tolerated   Complete by: As directed    Weight bearing as tolerated with assist device (walker, cane, etc) as directed, use it as long as suggested by your surgeon or therapist, typically at least 4-6 weeks.   TED hose   Complete by: As directed    Use stockings (TED hose) for 2 weeks on both leg(s).  You may remove them at night for sleeping.      Allergies as of 01/13/2019   No Known  Allergies     Medication List    STOP taking these medications   ibuprofen 200 MG tablet Commonly known as: ADVIL     TAKE these medications   ALIVE MENS ENERGY PO Take 1 tablet by mouth daily.   aspirin 81 MG chewable tablet Commonly known as: Aspirin Childrens Chew 1 tablet (81 mg total) by mouth 2 (two) times daily. Take for 4 weeks, then resume regular dose.   benzonatate 100 MG capsule Commonly known as: TESSALON Take 100 mg by mouth 2 (two) times daily as needed for cough.   docusate sodium 100 MG capsule Commonly known  as: Colace Take 1 capsule (100 mg total) by mouth 2 (two) times daily.   ferrous sulfate 325 (65 FE) MG tablet Commonly known as: FerrouSul Take 1 tablet (325 mg total) by mouth 3 (three) times daily with meals for 14 days.   HYDROcodone-acetaminophen 5-325 MG tablet Commonly known as: Norco Take 1-2 tablets by mouth every 4 (four) hours as needed for moderate pain or severe pain.   Melatonin 10 MG Tabs Take 10 mg by mouth at bedtime.   methocarbamol 500 MG tablet Commonly known as: Robaxin Take 1 tablet (500 mg total) by mouth every 6 (six) hours as needed for muscle spasms.   omeprazole 20 MG capsule Commonly known as: PRILOSEC Take 20 mg by mouth daily.   polyethylene glycol 17 g packet Commonly known as: MIRALAX / GLYCOLAX Take 17 g by mouth 2 (two) times daily. What changed:   how much to take  when to take this  reasons to take this   rosuvastatin 10 MG tablet Commonly known as: CRESTOR Take 10 mg by mouth daily.   temazepam 15 MG capsule Commonly known as: RESTORIL Take 15 mg by mouth at bedtime.            Discharge Care Instructions  (From admission, onward)         Start     Ordered   01/13/19 0000  Change dressing    Comments: Maintain surgical dressing until follow up in the clinic. If the edges start to pull up, may reinforce with tape. If the dressing is no longer working, may remove and cover with gauze  and tape, but must keep the area dry and clean.  Call with any questions or concerns.   01/13/19 0757           Signed: West Pugh. Keefer Soulliere   PA-C  01/16/2019, 9:42 PM

## 2019-01-17 DIAGNOSIS — Z125 Encounter for screening for malignant neoplasm of prostate: Secondary | ICD-10-CM | POA: Diagnosis not present

## 2019-01-17 DIAGNOSIS — Z23 Encounter for immunization: Secondary | ICD-10-CM | POA: Diagnosis not present

## 2019-01-17 DIAGNOSIS — E7849 Other hyperlipidemia: Secondary | ICD-10-CM | POA: Diagnosis not present

## 2019-01-17 DIAGNOSIS — R7301 Impaired fasting glucose: Secondary | ICD-10-CM | POA: Diagnosis not present

## 2019-01-20 DIAGNOSIS — R82998 Other abnormal findings in urine: Secondary | ICD-10-CM | POA: Diagnosis not present

## 2019-01-24 DIAGNOSIS — R7301 Impaired fasting glucose: Secondary | ICD-10-CM | POA: Diagnosis not present

## 2019-01-24 DIAGNOSIS — C189 Malignant neoplasm of colon, unspecified: Secondary | ICD-10-CM | POA: Diagnosis not present

## 2019-01-24 DIAGNOSIS — Z0000 Encounter for general adult medical examination without abnormal findings: Secondary | ICD-10-CM | POA: Diagnosis not present

## 2019-01-24 DIAGNOSIS — E785 Hyperlipidemia, unspecified: Secondary | ICD-10-CM | POA: Diagnosis not present

## 2019-02-01 DIAGNOSIS — Z8546 Personal history of malignant neoplasm of prostate: Secondary | ICD-10-CM | POA: Diagnosis not present

## 2019-02-01 DIAGNOSIS — N400 Enlarged prostate without lower urinary tract symptoms: Secondary | ICD-10-CM | POA: Diagnosis not present

## 2019-02-02 DIAGNOSIS — Z1212 Encounter for screening for malignant neoplasm of rectum: Secondary | ICD-10-CM | POA: Diagnosis not present

## 2019-02-22 DIAGNOSIS — Z471 Aftercare following joint replacement surgery: Secondary | ICD-10-CM | POA: Diagnosis not present

## 2019-02-22 DIAGNOSIS — Z96642 Presence of left artificial hip joint: Secondary | ICD-10-CM | POA: Diagnosis not present

## 2019-02-28 DIAGNOSIS — M5136 Other intervertebral disc degeneration, lumbar region: Secondary | ICD-10-CM | POA: Diagnosis not present

## 2019-02-28 DIAGNOSIS — M9905 Segmental and somatic dysfunction of pelvic region: Secondary | ICD-10-CM | POA: Diagnosis not present

## 2019-02-28 DIAGNOSIS — M9903 Segmental and somatic dysfunction of lumbar region: Secondary | ICD-10-CM | POA: Diagnosis not present

## 2019-02-28 DIAGNOSIS — M9904 Segmental and somatic dysfunction of sacral region: Secondary | ICD-10-CM | POA: Diagnosis not present

## 2019-03-28 DIAGNOSIS — R7989 Other specified abnormal findings of blood chemistry: Secondary | ICD-10-CM | POA: Diagnosis not present

## 2019-03-28 DIAGNOSIS — D649 Anemia, unspecified: Secondary | ICD-10-CM | POA: Diagnosis not present

## 2019-06-01 ENCOUNTER — Emergency Department (HOSPITAL_COMMUNITY)
Admission: EM | Admit: 2019-06-01 | Discharge: 2019-06-01 | Disposition: A | Discharge status: Home / Self Care | Payer: Medicare Other | Attending: Emergency Medicine | Admitting: Emergency Medicine

## 2019-06-01 ENCOUNTER — Encounter (HOSPITAL_COMMUNITY): Payer: Self-pay | Admitting: *Deleted

## 2019-06-01 ENCOUNTER — Other Ambulatory Visit: Payer: Self-pay

## 2019-06-01 DIAGNOSIS — S0101XA Laceration without foreign body of scalp, initial encounter: Secondary | ICD-10-CM | POA: Insufficient documentation

## 2019-06-01 DIAGNOSIS — Z96641 Presence of right artificial hip joint: Secondary | ICD-10-CM | POA: Insufficient documentation

## 2019-06-01 DIAGNOSIS — Z96611 Presence of right artificial shoulder joint: Secondary | ICD-10-CM | POA: Insufficient documentation

## 2019-06-01 DIAGNOSIS — Y9259 Other trade areas as the place of occurrence of the external cause: Secondary | ICD-10-CM | POA: Diagnosis not present

## 2019-06-01 DIAGNOSIS — S81012A Laceration without foreign body, left knee, initial encounter: Secondary | ICD-10-CM | POA: Insufficient documentation

## 2019-06-01 DIAGNOSIS — Y999 Unspecified external cause status: Secondary | ICD-10-CM | POA: Insufficient documentation

## 2019-06-01 DIAGNOSIS — Z96651 Presence of right artificial knee joint: Secondary | ICD-10-CM | POA: Insufficient documentation

## 2019-06-01 DIAGNOSIS — Z87891 Personal history of nicotine dependence: Secondary | ICD-10-CM | POA: Diagnosis not present

## 2019-06-01 DIAGNOSIS — Y9301 Activity, walking, marching and hiking: Secondary | ICD-10-CM | POA: Diagnosis not present

## 2019-06-01 DIAGNOSIS — W01110A Fall on same level from slipping, tripping and stumbling with subsequent striking against sharp glass, initial encounter: Secondary | ICD-10-CM | POA: Diagnosis not present

## 2019-06-01 DIAGNOSIS — Z8546 Personal history of malignant neoplasm of prostate: Secondary | ICD-10-CM | POA: Diagnosis not present

## 2019-06-01 DIAGNOSIS — W19XXXA Unspecified fall, initial encounter: Secondary | ICD-10-CM

## 2019-06-01 MED ORDER — LIDOCAINE HCL (PF) 1 % IJ SOLN
10.0000 mL | Freq: Once | INTRAMUSCULAR | Status: DC
  Filled 2019-06-01: qty 30

## 2019-06-01 MED ORDER — LIDOCAINE-EPINEPHRINE (PF) 1 %-1:200000 IJ SOLN: 10.0000 mL | Freq: Once | INTRAMUSCULAR | Status: DC

## 2019-06-01 MED ORDER — BACITRACIN ZINC 500 UNIT/GM EX OINT
TOPICAL_OINTMENT | CUTANEOUS | Status: AC
  Filled 2019-06-01: qty 1.8

## 2019-06-01 NOTE — ED Triage Notes (Signed)
Pt at [employer] [facility] [employer] store, tripped over a bag and fell forward, NO LOC, denies neck pain as he moves his neck in triage. Laceration to top of head approx 4 in long, avulsion type injury to left knee, left middle finger injury

## 2019-06-01 NOTE — ED Notes (Signed)
Pt has large laceration on scalp as well as 2 inch lac on left knee. Pt denies any nausea or vomiting. Pt denies LOC or vision changes. Pt is Alert and oriented

## 2019-06-01 NOTE — ED Provider Notes (Signed)
[place] DEPT Provider Note   CSN: [id number] Arrival date & time: 06/01/19  1344     History Chief Complaint  Patient presents with  . Fall  . Laceration    Steven Weeks is a 79 y.o. male.  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 Someone had left a rather large bag right up against the counter close to his feet. As he turned to go he was tripped by it and fell forward striking the top of his head on a glass partition. He then fell onto his knee and got a small cut on the left knee. Patient denies he got knocked out. He denies he has any generalized headache. He is not on blood thinners. No neck pain. No weakness numbness or tingling of the extremities. He did not injure his chest or abdomen. He reports the only problem is the cut on the top of his head which is not very painful, a little bit of pain in the knee where it has a cut.    Past Medical History:  Diagnosis Date  . Allergic rhinitis   . Cataracts, bilateral   . Colon cancer ([place]) 1992  . Diverticulosis    never been told  . ED (erectile dysfunction)   . Falls    none recent  . Frequency of urination   . GERD (gastroesophageal reflux disease)   . Hearing loss    more per left ear  . History of chemotherapy   . History of ear infections as a child   . History of external beam radiation therapy    prostate--  07-17-2014 to 08-20-2014--  43y  . History of hiatal hernia   . History of malignant neoplasm of colon    1994--- S/P  HEMICOLECTOMY--  CHEMOTHERAPY-- no recurrence  . History of perforated ear drum    left ear   . Hyperlipidemia   . Infection of mastoid bone    left ear  . Insomnia   . Multiple lipomas   . Nocturia   . OA (osteoarthritis)   . Olecranon bursitis   . Prostate cancer ([place]) 2016   Stage T2a,  Gleason 4+4,  PSA 7.645,  vol. 56.82cc--  43y external radiation 07-17-2014 to 08-20-2014  . Shoulder injury    right / secondary to fall   . Torn  meniscus    right knee approx 12 years ago     Patient Active Problem List   Diagnosis Date Noted  . Overweight (BMI 25.0-29.9) 01/13/2019  . S/P left THA, AA 01/12/2019  . S/P shoulder replacement, right 06/18/2017  . Right hip pain 11/14/2015  . S/P right TKA 04/22/2015  . S/P knee replacement 04/22/2015  . Stage T2a Adenocarcinoma of the Prostate with a Gleason's Score of 4+4 and a PSA of 7.65 04/17/2014    Past Surgical History:  Procedure Laterality Date  . COLON SURGERY  1994  . COLONOSCOPY    . KNEE ARTHROSCOPY Right 2003 approx  . pilonidial cyst    . PROSTATE BIOPSY    . RADIOACTIVE SEED IMPLANT N/A 09/07/2014   Procedure: RADIOACTIVE SEED IMPLANT;  Surgeon: Kathie Rhodes, MD;  Location: [facility];  Service: Urology;  Laterality: N/A;  . REVERSE SHOULDER ARTHROPLASTY Right 06/18/2017   Procedure: RIGHT REVERSE SHOULDER ARTHROPLASTY;  Surgeon: Netta Cedars, MD;  Location: [facility];  Service: Orthopedics;  Laterality: Right;  . TONSILLECTOMY    . TOTAL HIP ARTHROPLASTY Left 01/12/2019  Procedure: TOTAL HIP ARTHROPLASTY ANTERIOR APPROACH;  Surgeon: Paralee Cancel, MD;  Location: [facility];  Service: Orthopedics;  Laterality: Left;  70 mins  . TOTAL KNEE ARTHROPLASTY Right 04/22/2015   Procedure: RIGHT TOTAL KNEE ARTHROPLASTY;  Surgeon: Paralee Cancel, MD;  Location: [facility];  Service: Orthopedics;  Laterality: Right;  Marland Kitchen VASECTOMY  1970's       Family History  Problem Relation Age of Onset  . Cancer Mother        colon  . Hypertension Mother   . Cancer Father        lung and bladder    Social History   Tobacco Use  . Smoking status: Former Smoker    Packs/day: 1.50    Years: 27.00    Pack years: 40.50    Types: Cigarettes    Quit date: 05/18/1981    Years since quitting: 38.0  . Smokeless tobacco: Never Used  Substance Use Topics  . Alcohol use: Yes    Alcohol/week: 0.0 standard drinks    Comment: daily - 2 beers per day  . Drug use: No    Home  Medications Prior to Admission medications   Medication Sig Start Date End Date Taking? Authorizing Provider  benzonatate (TESSALON) 100 MG capsule Take 100 mg by mouth 2 (two) times daily as needed for cough.    [provider]  docusate sodium (COLACE) 100 MG capsule Take 1 capsule (100 mg total) by mouth 2 (two) times daily. 01/13/19   Danae Orleans, PA-C  ferrous sulfate (FERROUSUL) 325 (65 FE) MG tablet Take 1 tablet (325 mg total) by mouth 3 (three) times daily with meals for 14 days. 01/13/19 01/27/19  Danae Orleans, PA-C  HYDROcodone-acetaminophen (NORCO) 5-325 MG tablet Take 1-2 tablets by mouth every 4 (four) hours as needed for moderate pain or severe pain. 01/13/19   Danae Orleans, PA-C  Melatonin 10 MG TABS Take 10 mg by mouth at bedtime.    [provider]  methocarbamol (ROBAXIN) 500 MG tablet Take 1 tablet (500 mg total) by mouth every 6 (six) hours as needed for muscle spasms. 01/13/19   Danae Orleans, PA-C  Multiple Vitamins-Minerals (ALIVE MENS ENERGY PO) Take 1 tablet by mouth daily.    [provider]  omeprazole (PRILOSEC) 20 MG capsule Take 20 mg by mouth daily.     [provider]  polyethylene glycol (MIRALAX / GLYCOLAX) 17 g packet Take 17 g by mouth 2 (two) times daily. 01/13/19   Danae Orleans, PA-C  rosuvastatin (CRESTOR) 10 MG tablet Take 10 mg by mouth daily.    [provider]  temazepam (RESTORIL) 15 MG capsule Take 15 mg by mouth at bedtime.    [provider]    Allergies    Patient has no known allergies.  Review of Systems   Review of Systems 10 Systems reviewed and are negative for acute change except as noted in the HPI.  Physical Exam Updated Vital Signs BP (!) 180/92 (BP Location: Right Arm)   Pulse (!) 104   Temp 98.9 F (37.2 C) (Oral)   Resp 16   Ht 5\' 11"  (1.803 m)   Wt 90.7 kg   SpO2 100%   BMI 27.89 kg/m   Physical Exam Constitutional:      Comments: Alert and appropriate. No  distress. Up and ambulatory about the room.  HENT:     Head:     Comments: 5 cm linear laceration right at the vertex of the head  in the scalp toward the forehead. It does gape with traction and is through the dermis but well aligned and no active bleeding.    Nose: Nose normal.     Mouth/Throat:     Mouth: Mucous membranes are moist.     Pharynx: Oropharynx is clear.  Eyes:     Extraocular Movements: Extraocular movements intact.     Pupils: Pupils are equal, round, and reactive to light.  Neck:     Comments: No C-spine tenderness to palpation. Cardiovascular:     Rate and Rhythm: Normal rate and regular rhythm.  Pulmonary:     Effort: Pulmonary effort is normal.     Breath sounds: Normal breath sounds.  Chest:     Chest wall: No tenderness.  Abdominal:     General: There is no distension.     Palpations: Abdomen is soft.     Tenderness: There is no abdominal tenderness.  Musculoskeletal:        General: Normal range of motion.     Comments: Patient amatory slight difficulty. All extremities moving with coordinated fashion. Patient has a approximately 1 and half centimeter laceration over the patella on the left knee. No effusion of the joint. Normal range of motion.  Neurological:     General: No focal deficit present.     Mental Status: He is oriented to person, place, and time.     Coordination: Coordination normal.  Psychiatric:        Mood and Affect: Mood normal.     ED Results / Procedures / Treatments   Labs (all labs ordered are listed, but only abnormal results are displayed) Labs Reviewed - No data to display  EKG None  Radiology No results found.  Procedures .Marland KitchenLaceration Repair  Date/Time: 06/01/2019 5:04 PM Performed by: Charlesetta Shanks, MD Authorized by: Charlesetta Shanks, MD   Consent:    Consent obtained:  Verbal   Consent given by:  Patient   Risks discussed:  Infection, pain, poor cosmetic result and poor wound healing Anesthesia (see MAR for  exact dosages):    Anesthesia method:  Local infiltration   Local anesthetic:  Lidocaine 1% w/o epi Laceration details:    Location:  Scalp   Scalp location:  Frontal   Length (cm):  6   Depth (mm):  4 Pre-procedure details:    Preparation:  Patient was prepped and draped in usual sterile fashion Exploration:    Hemostasis achieved with:  Direct pressure   Wound extent: areolar tissue violated     Contaminated: no   Treatment:    Area cleansed with:  Saline   Amount of cleaning:  Standard Skin repair:    Repair method:  Staples   Number of staples:  6 Approximation:    Approximation:  Close Post-procedure details:    Dressing:  Antibiotic ointment .Marland KitchenLaceration Repair  Date/Time: 06/01/2019 5:05 PM Performed by: Charlesetta Shanks, MD Authorized by: Charlesetta Shanks, MD   Consent:    Consent obtained:  Verbal   Consent given by:  Patient   Risks discussed:  Infection, pain, poor cosmetic result and poor wound healing Anesthesia (see MAR for exact dosages):    Anesthesia method:  Local infiltration   Local anesthetic:  Lidocaine 1% w/o epi Laceration details:    Location:  Leg   Leg location:  L knee   Length (cm):  2   Depth (mm):  5 Pre-procedure details:    Preparation:  Patient was prepped and draped in usual sterile  fashion Exploration:    Hemostasis achieved with:  Direct pressure   Wound exploration: entire depth of wound probed and visualized     Wound extent: areolar tissue violated     Contaminated: no   Treatment:    Area cleansed with:  Saline   Amount of cleaning:  Standard   Irrigation solution:  Sterile saline   Irrigation volume:  40   Irrigation method:  Syringe   Visualized foreign bodies/material removed: no   Skin repair:    Repair method:  Staples   Number of staples:  2 Approximation:    Approximation:  Close Post-procedure details:    Dressing:  Antibiotic ointment   (including critical care time)  Medications Ordered in  ED Medications - No data to display  ED Course  I have reviewed the triage vital signs and the nursing notes.  Pertinent labs & imaging results that were available during my care of the patient were reviewed by me and considered in my medical decision making (see chart for details).    MDM Rules/Calculators/A&P                      Patient is not on any anticoagulants.  He struck the top of the scalp on a glass partition.  He has a linear laceration that is repaired.  No loss of consciousness and no headache.  At this time, I do not feel that he needs CT imaging.  Mental status is normal.  neurologic function is normal. Patient also fell onto the left knee causing a small irregular laceration.  No hematoma.  No joint effusion.  Patient is ambulating with no difficulty and nonantalgic gait.  At this time, I did not feel that further imaging is indicated as there is no sign of difficulty with ambulation or range of motion.  Laceration cleaned and repaired as outlined. Wound care and return precaution information provided. Final Clinical Impression(s) / ED Diagnoses Final diagnoses:  Laceration of scalp, initial encounter  Knee laceration, left, initial encounter  Fall, initial encounter    Rx / DC Orders ED Discharge Orders    None       Charlesetta Shanks, MD 06/01/19 1711

## 2019-06-01 NOTE — ED Notes (Signed)
Pts left knee cleaned with NS, antibiotic oinment applied along with bandage. Pts head lac also cleaned after staples applied, antibiotic ointment applied.

## 2019-06-01 NOTE — Discharge Instructions (Addendum)
1.  You can clean your wounds gently with mild soap and water, rinse well and pat dry.  Apply antibiotic ointment.  You should see your doctor in 7 to 10 days for wound recheck and staple removal. 2.  Return to the emergency department if you develop redness, drainage or signs of infection of the wounds.  If you develop fever.  Return if you develop confusion headache visual changes or signs of head injury.

## 2019-06-12 DIAGNOSIS — S81012A Laceration without foreign body, left knee, initial encounter: Secondary | ICD-10-CM | POA: Diagnosis not present

## 2019-06-12 DIAGNOSIS — S0191XA Laceration without foreign body of unspecified part of head, initial encounter: Secondary | ICD-10-CM | POA: Diagnosis not present

## 2019-06-12 DIAGNOSIS — Z4802 Encounter for removal of sutures: Secondary | ICD-10-CM | POA: Diagnosis not present

## 2019-07-26 DIAGNOSIS — C61 Malignant neoplasm of prostate: Secondary | ICD-10-CM | POA: Diagnosis not present

## 2019-09-19 DIAGNOSIS — H524 Presbyopia: Secondary | ICD-10-CM | POA: Diagnosis not present

## 2019-09-27 DIAGNOSIS — L723 Sebaceous cyst: Secondary | ICD-10-CM | POA: Diagnosis not present

## 2019-09-27 DIAGNOSIS — D225 Melanocytic nevi of trunk: Secondary | ICD-10-CM | POA: Diagnosis not present

## 2019-09-27 DIAGNOSIS — L821 Other seborrheic keratosis: Secondary | ICD-10-CM | POA: Diagnosis not present

## 2019-09-27 DIAGNOSIS — D2272 Melanocytic nevi of left lower limb, including hip: Secondary | ICD-10-CM | POA: Diagnosis not present

## 2019-10-12 DIAGNOSIS — H25011 Cortical age-related cataract, right eye: Secondary | ICD-10-CM | POA: Diagnosis not present

## 2019-10-12 DIAGNOSIS — H25811 Combined forms of age-related cataract, right eye: Secondary | ICD-10-CM | POA: Diagnosis not present

## 2019-10-12 DIAGNOSIS — H2511 Age-related nuclear cataract, right eye: Secondary | ICD-10-CM | POA: Diagnosis not present

## 2019-11-09 DIAGNOSIS — H25812 Combined forms of age-related cataract, left eye: Secondary | ICD-10-CM | POA: Diagnosis not present

## 2019-11-09 DIAGNOSIS — H2512 Age-related nuclear cataract, left eye: Secondary | ICD-10-CM | POA: Diagnosis not present

## 2019-11-09 DIAGNOSIS — H25012 Cortical age-related cataract, left eye: Secondary | ICD-10-CM | POA: Diagnosis not present

## 2020-02-20 DIAGNOSIS — Z125 Encounter for screening for malignant neoplasm of prostate: Secondary | ICD-10-CM | POA: Diagnosis not present

## 2020-02-20 DIAGNOSIS — E785 Hyperlipidemia, unspecified: Secondary | ICD-10-CM | POA: Diagnosis not present

## 2020-02-20 DIAGNOSIS — R7301 Impaired fasting glucose: Secondary | ICD-10-CM | POA: Diagnosis not present

## 2020-02-26 DIAGNOSIS — R82998 Other abnormal findings in urine: Secondary | ICD-10-CM | POA: Diagnosis not present

## 2020-02-26 DIAGNOSIS — E785 Hyperlipidemia, unspecified: Secondary | ICD-10-CM | POA: Diagnosis not present

## 2020-02-26 DIAGNOSIS — Z0000 Encounter for general adult medical examination without abnormal findings: Secondary | ICD-10-CM | POA: Diagnosis not present

## 2020-02-26 DIAGNOSIS — R7301 Impaired fasting glucose: Secondary | ICD-10-CM | POA: Diagnosis not present

## 2020-02-26 DIAGNOSIS — C189 Malignant neoplasm of colon, unspecified: Secondary | ICD-10-CM | POA: Diagnosis not present

## 2020-03-08 DIAGNOSIS — Z1212 Encounter for screening for malignant neoplasm of rectum: Secondary | ICD-10-CM | POA: Diagnosis not present

## 2020-03-25 DIAGNOSIS — D61818 Other pancytopenia: Secondary | ICD-10-CM | POA: Diagnosis not present

## 2020-03-25 DIAGNOSIS — R030 Elevated blood-pressure reading, without diagnosis of hypertension: Secondary | ICD-10-CM | POA: Diagnosis not present

## 2020-03-26 ENCOUNTER — Telehealth: Payer: Self-pay | Admitting: Hematology

## 2020-03-26 NOTE — Telephone Encounter (Signed)
Received an urgent new hem referral from [facility] for pancytopenia. Mr. Coutts has been cld and scheduled to see Dr. Irene Limbo on 11/16 at 11am. Pt aware to arrive 20 minutes early.

## 2020-04-02 ENCOUNTER — Inpatient Hospital Stay: Payer: Medicare Other | Attending: Hematology | Admitting: Hematology

## 2020-04-02 ENCOUNTER — Other Ambulatory Visit: Payer: Self-pay

## 2020-04-02 ENCOUNTER — Inpatient Hospital Stay: Payer: Medicare Other

## 2020-04-02 VITALS — BP 144/85 | HR 73 | Temp 97.2°F | Resp 18 | Ht 71.0 in | Wt 198.9 lb

## 2020-04-02 DIAGNOSIS — Z9049 Acquired absence of other specified parts of digestive tract: Secondary | ICD-10-CM | POA: Insufficient documentation

## 2020-04-02 DIAGNOSIS — D72819 Decreased white blood cell count, unspecified: Secondary | ICD-10-CM

## 2020-04-02 DIAGNOSIS — D696 Thrombocytopenia, unspecified: Secondary | ICD-10-CM

## 2020-04-02 DIAGNOSIS — Z923 Personal history of irradiation: Secondary | ICD-10-CM | POA: Diagnosis not present

## 2020-04-02 DIAGNOSIS — Z8546 Personal history of malignant neoplasm of prostate: Secondary | ICD-10-CM | POA: Insufficient documentation

## 2020-04-02 DIAGNOSIS — Z79899 Other long term (current) drug therapy: Secondary | ICD-10-CM | POA: Insufficient documentation

## 2020-04-02 DIAGNOSIS — Z85038 Personal history of other malignant neoplasm of large intestine: Secondary | ICD-10-CM | POA: Insufficient documentation

## 2020-04-02 DIAGNOSIS — Z87891 Personal history of nicotine dependence: Secondary | ICD-10-CM | POA: Insufficient documentation

## 2020-04-02 DIAGNOSIS — D61818 Other pancytopenia: Secondary | ICD-10-CM | POA: Insufficient documentation

## 2020-04-02 LAB — CMP (CANCER CENTER ONLY)
ALT: 15 U/L (ref 0–44)
AST: 21 U/L (ref 15–41)
Albumin: 4.3 g/dL (ref 3.5–5.0)
Alkaline Phosphatase: 50 U/L (ref 38–126)
Anion gap: 8 (ref 5–15)
BUN: 22 mg/dL (ref 8–23)
CO2: 25 mmol/L (ref 22–32)
Calcium: 9.4 mg/dL (ref 8.9–10.3)
Chloride: 106 mmol/L (ref 98–111)
Creatinine: 1.29 mg/dL — ABNORMAL HIGH (ref 0.61–1.24)
GFR, Estimated: 56 mL/min — ABNORMAL LOW (ref >60)
Glucose, Bld: 95 mg/dL (ref 70–99)
Potassium: 4.5 mmol/L (ref 3.5–5.1)
Sodium: 139 mmol/L (ref 135–145)
Total Bilirubin: 0.8 mg/dL (ref 0.3–1.2)
Total Protein: 7.8 g/dL (ref 6.5–8.1)

## 2020-04-02 LAB — LACTATE DEHYDROGENASE: LDH: 202 U/L — ABNORMAL HIGH (ref 98–192)

## 2020-04-02 LAB — CBC WITH DIFFERENTIAL/PLATELET
Abs Immature Granulocytes: 0.04 10*3/uL (ref 0.00–0.07)
Basophils Absolute: 0 10*3/uL (ref 0.0–0.1)
Basophils Relative: 1 %
Eosinophils Absolute: 0.1 10*3/uL (ref 0.0–0.5)
Eosinophils Relative: 2 %
HCT: 43 % (ref 39.0–52.0)
Hemoglobin: 14.7 g/dL (ref 13.0–17.0)
Immature Granulocytes: 1 %
Lymphocytes Relative: 21 %
Lymphs Abs: 0.7 10*3/uL (ref 0.7–4.0)
MCH: 31.5 pg (ref 26.0–34.0)
MCHC: 34.2 g/dL (ref 30.0–36.0)
MCV: 92.3 fL (ref 80.0–100.0)
Monocytes Absolute: 0.3 10*3/uL (ref 0.1–1.0)
Monocytes Relative: 9 %
Neutro Abs: 2.3 10*3/uL (ref 1.7–7.7)
Neutrophils Relative %: 66 %
Platelets: 152 10*3/uL (ref 150–400)
RBC: 4.66 MIL/uL (ref 4.22–5.81)
RDW: 11.5 % (ref 11.5–15.5)
WBC: 3.5 10*3/uL — ABNORMAL LOW (ref 4.0–10.5)
nRBC: 0 % (ref 0.0–0.2)

## 2020-04-02 LAB — VITAMIN B12: Vitamin B-12: 280 pg/mL (ref 180–914)

## 2020-04-02 LAB — IMMATURE PLATELET FRACTION: Immature Platelet Fraction: 3.5 % (ref 1.2–8.6)

## 2020-04-02 LAB — FERRITIN: Ferritin: 70 ng/mL (ref 24–336)

## 2020-04-02 NOTE — Progress Notes (Signed)
HEMATOLOGY/ONCOLOGY CONSULTATION NOTE  Date of Service: 04/02/2020  Patient Care Team: Prince Solian, MD as PCP - General (Internal Medicine)  CHIEF COMPLAINTS/PURPOSE OF CONSULTATION:  Pancytopenia  HISTORY OF PRESENTING ILLNESS:   Steven Weeks is a wonderful 79 y.o. male who has been referred to [place] by Ria Comment Lee-FNP for evaluation and management of pancytopenia. The pt reports that he is doing well overall.   The pt reports that his labs on 10/05 were taken five days after his COVID19 booster. During this time the pt was drinking several alcoholic drinks per day. After his labs on 10/05 he reduced his alcohol intake to a few drinks on the weekend.   Pt had Colon cancer for which he had a hemicolectomy in 1994. He also had Prostate cancer in 2015, which was treated with external beam radiation, radioactive seed implant, and two years of Lupron injections. He continues to receive regular Colonoscopies and labwork to monitor these conditions. He denies any new GI symptoms.   Pt has been on Omeprazole for over 10 years. He was started on Omeprazole as he was having dysphagia. He has been off of multivitamins for 2-3 years. Pt denies any recent infection symptoms and has felt well over the last 6-12 months. He has no known occupational or hobby-related chemical exposure.  Most recent lab results (03/25/2020) of CBC is as follows: all values are WNL except for WBC at 3.66K, RBC at 4.1, MCH at 32.8, RDW at 12.0, PLT at 127K. 02/20/2020 CBC w/diff & CMP is as follows: all values are WNL except for WBC at 2.30K, RDW at 11.9, PLT at 120K, Neutro Abs at 1.0K, Mono Rel at 12.7, Glucose at 113.  On review of systems, pt reports chronic back pain and denies new fatigue, new bone pain, fevers, chills, night sweats, unexpected weight loss, abdominal pain, bowel habit changes and any other symptoms.   On PMHx the pt reports Colon CA s/p hemicolectomy (1994), Prostate CA (2015), GERD, Knee  Arthroscopy, Total Hip Arthroplasty, Reverse Shoulder Arthroplasty. On Social Hx the pt reports that he quit smoking in 1983. Pt is currently semi-retired. At his heaviest the pt was drinking four alcoholic beverages a day. He recently cut down his alcohol intake.   MEDICAL HISTORY:  Past Medical History:  Diagnosis Date  . Allergic rhinitis   . Cataracts, bilateral   . Colon cancer ([place]) 1992  . Diverticulosis    never been told  . ED (erectile dysfunction)   . Falls    none recent  . Frequency of urination   . GERD (gastroesophageal reflux disease)   . Hearing loss    more per left ear  . History of chemotherapy   . History of ear infections as a child   . History of external beam radiation therapy    prostate--  07-17-2014 to 08-20-2014--  43y  . History of hiatal hernia   . History of malignant neoplasm of colon    1994--- S/P  HEMICOLECTOMY--  CHEMOTHERAPY-- no recurrence  . History of perforated ear drum    left ear   . Hyperlipidemia   . Infection of mastoid bone    left ear  . Insomnia   . Multiple lipomas   . Nocturia   . OA (osteoarthritis)   . Olecranon bursitis   . Prostate cancer ([place]) 2016   Stage T2a,  Gleason 4+4,  PSA 7.645,  vol. 56.82cc--  43y external radiation 07-17-2014 to 08-20-2014  . Shoulder injury  right / secondary to fall   . Torn meniscus    right knee approx 12 years ago     SURGICAL HISTORY: Past Surgical History:  Procedure Laterality Date  . COLON SURGERY  1994  . COLONOSCOPY    . KNEE ARTHROSCOPY Right 2003 approx  . pilonidial cyst    . PROSTATE BIOPSY    . RADIOACTIVE SEED IMPLANT N/A 09/07/2014   Procedure: RADIOACTIVE SEED IMPLANT;  Surgeon: Kathie Rhodes, MD;  Location: [facility];  Service: Urology;  Laterality: N/A;  . REVERSE SHOULDER ARTHROPLASTY Right 06/18/2017   Procedure: RIGHT REVERSE SHOULDER ARTHROPLASTY;  Surgeon: Netta Cedars, MD;  Location: [place];  Service: Orthopedics;  Laterality: Right;  .  TONSILLECTOMY    . TOTAL HIP ARTHROPLASTY Left 01/12/2019   Procedure: TOTAL HIP ARTHROPLASTY ANTERIOR APPROACH;  Surgeon: Paralee Cancel, MD;  Location: [facility];  Service: Orthopedics;  Laterality: Left;  70 mins  . TOTAL KNEE ARTHROPLASTY Right 04/22/2015   Procedure: RIGHT TOTAL KNEE ARTHROPLASTY;  Surgeon: Paralee Cancel, MD;  Location: [facility];  Service: Orthopedics;  Laterality: Right;  Marland Kitchen VASECTOMY  1970's    SOCIAL HISTORY: Social History   Socioeconomic History  . Marital status: Married    Spouse name: Not on file  . Number of children: Not on file  . Years of education: Not on file  . Highest education level: Not on file  Occupational History  . Not on file  Tobacco Use  . Smoking status: Former Smoker    Packs/day: 1.50    Years: 27.00    Pack years: 40.50    Types: Cigarettes    Quit date: 05/18/1981    Years since quitting: 38.9  . Smokeless tobacco: Never Used  Vaping Use  . Vaping Use: Never used  Substance and Sexual Activity  . Alcohol use: Yes    Alcohol/week: 0.0 standard drinks    Comment: daily - 2 beers per day  . Drug use: No  . Sexual activity: Not on file  Other Topics Concern  . Not on file  Social History Narrative  . Not on file   Social Determinants of Health   Financial Resource Strain:   . Difficulty of Paying Living Expenses: Not on file  Food Insecurity:   . Worried About Charity fundraiser in the Last Year: Not on file  . Ran Out of Food in the Last Year: Not on file  Transportation Needs:   . Lack of Transportation (Medical): Not on file  . Lack of Transportation (Non-Medical): Not on file  Physical Activity:   . Days of Exercise per Week: Not on file  . Minutes of Exercise per Session: Not on file  Stress:   . Feeling of Stress : Not on file  Social Connections:   . Frequency of Communication with Friends and Family: Not on file  . Frequency of Social Gatherings with Friends and Family: Not on file  . Attends Religious Services:  Not on file  . Active Member of Clubs or Organizations: Not on file  . Attends Archivist Meetings: Not on file  . Marital Status: Not on file  Intimate Partner Violence:   . Fear of Current or Ex-Partner: Not on file  . Emotionally Abused: Not on file  . Physically Abused: Not on file  . Sexually Abused: Not on file    FAMILY HISTORY: Family History  Problem Relation Age of Onset  . Cancer Mother  colon  . Hypertension Mother   . Cancer Father        lung and bladder    ALLERGIES:  has No Known Allergies.  MEDICATIONS:  Current Outpatient Medications  Medication Sig Dispense Refill  . benzonatate (TESSALON) 100 MG capsule Take 100 mg by mouth 2 (two) times daily as needed for cough.    . docusate sodium (COLACE) 100 MG capsule Take 1 capsule (100 mg total) by mouth 2 (two) times daily. 28 capsule 0  . ferrous sulfate (FERROUSUL) 325 (65 FE) MG tablet Take 1 tablet (325 mg total) by mouth 3 (three) times daily with meals for 14 days. 42 tablet 0  . HYDROcodone-acetaminophen (NORCO) 5-325 MG tablet Take 1-2 tablets by mouth every 4 (four) hours as needed for moderate pain or severe pain. 60 tablet 0  . Melatonin 10 MG TABS Take 10 mg by mouth at bedtime.    . methocarbamol (ROBAXIN) 500 MG tablet Take 1 tablet (500 mg total) by mouth every 6 (six) hours as needed for muscle spasms. 40 tablet 0  . Multiple Vitamins-Minerals (ALIVE MENS ENERGY PO) Take 1 tablet by mouth daily.    Marland Kitchen omeprazole (PRILOSEC) 20 MG capsule Take 20 mg by mouth daily.     . polyethylene glycol (MIRALAX / GLYCOLAX) 17 g packet Take 17 g by mouth 2 (two) times daily. 28 packet 0  . rosuvastatin (CRESTOR) 10 MG tablet Take 10 mg by mouth daily.    . temazepam (RESTORIL) 15 MG capsule Take 15 mg by mouth at bedtime.     No current facility-administered medications for this visit.    REVIEW OF SYSTEMS:    10 Point review of Systems was done is negative except as noted above.  PHYSICAL  EXAMINATION: ECOG PERFORMANCE STATUS: 0 - Asymptomatic  . Vitals:   04/02/20 1145  BP: (!) 144/85  Pulse: 73  Resp: 18  Temp: (!) 97.2 F (36.2 C)  SpO2: 98%   Filed Weights   04/02/20 1145  Weight: 198 lb 14.4 oz (90.2 kg)   .Body mass index is 27.74 kg/m.  GENERAL:alert, in no acute distress and comfortable SKIN: no acute rashes, no significant lesions EYES: conjunctiva are pink and non-injected, sclera anicteric OROPHARYNX: MMM, no exudates, no oropharyngeal erythema or ulceration NECK: supple, no JVD LYMPH:  no palpable lymphadenopathy in the cervical, axillary or inguinal regions LUNGS: clear to auscultation b/l with normal respiratory effort HEART: regular rate & rhythm ABDOMEN:  normoactive bowel sounds , non tender, not distended. Extremity: no pedal edema PSYCH: alert & oriented x 3 with fluent speech NEURO: no focal motor/sensory deficits  LABORATORY DATA:  I have reviewed the data as listed  . CBC Latest Ref Rng & Units 04/02/2020 04/02/2020 01/13/2019  WBC 4.0 - 10.5 K/uL 3.5(L) - 8.1  Hemoglobin 13.0 - 17.0 g/dL 14.7 - 11.1(L)  Hematocrit 37.5 - 51.0 % 44.5 43.0 34.0(L)  Platelets 150 - 400 K/uL 152 - 157    . CMP Latest Ref Rng & Units 04/02/2020 01/13/2019 06/19/2017  Glucose 70 - 99 mg/dL 95 123(H) 130(H)  BUN 8 - 23 mg/dL 22 19 19   Creatinine 0.61 - 1.24 mg/dL 1.29(H) 1.11 1.23  Sodium 135 - 145 mmol/L 139 137 139  Potassium 3.5 - 5.1 mmol/L 4.5 4.5 4.5  Chloride 98 - 111 mmol/L 106 107 106  CO2 22 - 32 mmol/L 25 24 24   Calcium 8.9 - 10.3 mg/dL 9.4 8.2(L) 8.6(L)  Total Protein 6.5 - 8.1  g/dL 7.8 - -  Total Bilirubin 0.3 - 1.2 mg/dL 0.8 - -  Alkaline Phos 38 - 126 U/L 50 - -  AST 15 - 41 U/L 21 - -  ALT 0 - 44 U/L 15 - -     RADIOGRAPHIC STUDIES: I have personally reviewed the radiological images as listed and agreed with the findings in the report. No results found.  ASSESSMENT & PLAN:   79 yo with   1) Mild  Pancytopenia PLAN: -Discussed patient's most recent labs from 03/25/2020, improving WBC & PLT, no anemia.  -Advised pt that mild thrombocytopenia and leukopenia is not as concerning as they appear to be improving. -Advised pt that thrombocytopenia could be caused by medications, recent vaccine, or excessive alcohol intake.  -Advised pt that long term acid suppression can cause nutritional deficiencies, which can affect blood counts.  -Recommend pt connect with Gastroenterologist for Omeprazole management.  -Advised pt that advanced age can contribute to cytopenias.  -Recommend pt begin daily multivitamin or B-complex.  -Recommend pt begin 2000 IU Vitamin D daily. -Will gt labs today  -Will see back in 2 weeks via phone   FOLLOW UP: Labs today Phone visit with Dr Irene Limbo in 2 weeks  All of the patients questions were answered with apparent satisfaction. The patient knows to call the clinic with any problems, questions or concerns.  I spent 35 mins counseling the patient face to face. The total time spent in the appointment was 45 mins and more than 50% was on counseling and direct patient cares.    Sullivan Lone MD [place] AAHIVMS [facility] Hematology/Oncology Physician [facility]  (Office):       [phone] (Work cell):  [phone] (Fax):           [phone]  04/02/2020 5:28 PM  I, Yevette Edwards, am acting as a scribe for Dr. Sullivan Lone.   .I have reviewed the above documentation for accuracy and completeness, and I agree with the above. Brunetta Genera MD

## 2020-04-03 LAB — MULTIPLE MYELOMA PANEL, SERUM
Albumin SerPl Elph-Mcnc: 3.8 g/dL (ref 2.9–4.4)
Albumin/Glob SerPl: 1.2 (ref 0.7–1.7)
Alpha 1: 0.2 g/dL (ref 0.0–0.4)
Alpha2 Glob SerPl Elph-Mcnc: 0.8 g/dL (ref 0.4–1.0)
B-Globulin SerPl Elph-Mcnc: 1.1 g/dL (ref 0.7–1.3)
Gamma Glob SerPl Elph-Mcnc: 1.2 g/dL (ref 0.4–1.8)
Globulin, Total: 3.2 g/dL (ref 2.2–3.9)
IgA: 248 mg/dL (ref 61–437)
IgG (Immunoglobin G), Serum: 1073 mg/dL (ref 603–1613)
IgM (Immunoglobulin M), Srm: 153 mg/dL — ABNORMAL HIGH (ref 15–143)
Total Protein ELP: 7 g/dL (ref 6.0–8.5)

## 2020-04-03 LAB — FOLATE RBC
Folate, Hemolysate: 439 ng/mL
Folate, RBC: 987 ng/mL (ref >498)
Hematocrit: 44.5 % (ref 37.5–51.0)

## 2020-04-04 LAB — VITAMIN D 25 HYDROXY (VIT D DEFICIENCY, FRACTURES): Vit D, 25-Hydroxy: 38 ng/mL (ref 30–100)

## 2020-04-05 LAB — COPPER, SERUM: Copper: 103 ug/dL (ref 69–132)

## 2020-04-15 ENCOUNTER — Telehealth: Payer: Self-pay | Admitting: *Deleted

## 2020-04-15 NOTE — Telephone Encounter (Signed)
Patient called - will be traveling on 12/2 during phone appt time. Requested mobile # [phone] be used and added to chart. Records updated. Appointment note edited to include mobile number

## 2020-04-17 ENCOUNTER — Inpatient Hospital Stay: Payer: Medicare Other | Attending: Hematology | Admitting: Hematology

## 2020-04-17 DIAGNOSIS — D72819 Decreased white blood cell count, unspecified: Secondary | ICD-10-CM

## 2020-04-17 DIAGNOSIS — D696 Thrombocytopenia, unspecified: Secondary | ICD-10-CM

## 2020-04-17 DIAGNOSIS — Z79899 Other long term (current) drug therapy: Secondary | ICD-10-CM | POA: Insufficient documentation

## 2020-04-17 DIAGNOSIS — D61818 Other pancytopenia: Secondary | ICD-10-CM | POA: Insufficient documentation

## 2020-04-17 NOTE — Progress Notes (Signed)
HEMATOLOGY/ONCOLOGY CONSULTATION NOTE  Date of Service: 04/17/2020  Patient Care Team: Prince Solian, MD as PCP - General (Internal Medicine)  CHIEF COMPLAINTS/PURPOSE OF CONSULTATION:  Pancytopenia  HISTORY OF PRESENTING ILLNESS:  Steven Weeks is a wonderful 79 y.o. male who has been referred to [place] by Ria Comment Lee-FNP for evaluation and management of pancytopenia. The pt reports that he is doing well overall.   The pt reports that his labs on 10/05 were taken five days after his COVID19 booster. During this time the pt was drinking several alcoholic drinks per day. After his labs on 10/05 he reduced his alcohol intake to a few drinks on the weekend.   Pt had Colon cancer for which he had a hemicolectomy in 1994. He also had Prostate cancer in 2015, which was treated with external beam radiation, radioactive seed implant, and two years of Lupron injections. He continues to receive regular Colonoscopies and labwork to monitor these conditions. He denies any new GI symptoms.   Pt has been on Omeprazole for over 10 years. He was started on Omeprazole as he was having dysphagia. He has been off of multivitamins for 2-3 years. Pt denies any recent infection symptoms and has felt well over the last 6-12 months. He has no known occupational or hobby-related chemical exposure.  Most recent lab results (03/25/2020) of CBC is as follows: all values are WNL except for WBC at 3.66K, RBC at 4.1, MCH at 32.8, RDW at 12.0, PLT at 127K. 02/20/2020 CBC w/diff & CMP is as follows: all values are WNL except for WBC at 2.30K, RDW at 11.9, PLT at 120K, Neutro Abs at 1.0K, Mono Rel at 12.7, Glucose at 113.  On review of systems, pt reports chronic back pain and denies new fatigue, new bone pain, fevers, chills, night sweats, unexpected weight loss, abdominal pain, bowel habit changes and any other symptoms.   On PMHx the pt reports Colon CA s/p hemicolectomy (1994), Prostate CA (2015), GERD, Knee  Arthroscopy, Total Hip Arthroplasty, Reverse Shoulder Arthroplasty. On Social Hx the pt reports that he quit smoking in 1983. Pt is currently semi-retired. At his heaviest the pt was drinking four alcoholic beverages a day. He recently cut down his alcohol intake.   INTERVAL HISTORY:  I connected with  Steven Weeks on 04/17/20 by telephone and verified that I am speaking with the correct person using two identifiers.   I discussed the limitations of evaluation and management by telemedicine. The patient expressed understanding and agreed to proceed.  Other persons participating in the visit and their role in the encounter:        -Yevette Edwards, Medical Scribe  Patient's location: Home Provider's location: [facility] [employer] is a wonderful 79 y.o. male who is here for evaluation and management of pancytopenia. The patient's last visit with [place] was on 04/02/2020. The pt reports that he is doing well overall.  The pt reports that he has started taking a daily Vitamin B12 and Vitamin D since our last visit. He has been well in the interim.   Lab results (04/02/20) of CBC w/diff and CMP is as follows: all values are WNL except for WBC at 3.5K, Creatinine at 1.29, GFR Est at 56. 04/02/2020 LDH at 202 04/02/2020 Ferritin at 70 04/02/2020 Copper at 103 04/02/2020 Vitamin D at 38.0 04/02/2020 Vitamin B12 at 280 04/02/2020 Immature PLT Fract at 3.5 04/02/2020 Folate Hemolysate at 439.0, Folate RBC at 987 04/02/2020 MMP shows  all values are WNL except for IgM at 153  On review of systems, pt denies any other symptoms.    MEDICAL HISTORY:  Past Medical History:  Diagnosis Date  . Allergic rhinitis   . Cataracts, bilateral   . Colon cancer ([place]) 1992  . Diverticulosis    never been told  . ED (erectile dysfunction)   . Falls    none recent  . Frequency of urination   . GERD (gastroesophageal reflux disease)   . Hearing loss    more per left ear  . History of  chemotherapy   . History of ear infections as a child   . History of external beam radiation therapy    prostate--  07-17-2014 to 08-20-2014--  43y  . History of hiatal hernia   . History of malignant neoplasm of colon    1994--- S/P  HEMICOLECTOMY--  CHEMOTHERAPY-- no recurrence  . History of perforated ear drum    left ear   . Hyperlipidemia   . Infection of mastoid bone    left ear  . Insomnia   . Multiple lipomas   . Nocturia   . OA (osteoarthritis)   . Olecranon bursitis   . Prostate cancer ([place]) 2016   Stage T2a,  Gleason 4+4,  PSA 7.645,  vol. 56.82cc--  43y external radiation 07-17-2014 to 08-20-2014  . Shoulder injury    right / secondary to fall   . Torn meniscus    right knee approx 12 years ago     SURGICAL HISTORY: Past Surgical History:  Procedure Laterality Date  . COLON SURGERY  1994  . COLONOSCOPY    . KNEE ARTHROSCOPY Right 2003 approx  . pilonidial cyst    . PROSTATE BIOPSY    . RADIOACTIVE SEED IMPLANT N/A 09/07/2014   Procedure: RADIOACTIVE SEED IMPLANT;  Surgeon: Kathie Rhodes, MD;  Location: [facility];  Service: Urology;  Laterality: N/A;  . REVERSE SHOULDER ARTHROPLASTY Right 06/18/2017   Procedure: RIGHT REVERSE SHOULDER ARTHROPLASTY;  Surgeon: Netta Cedars, MD;  Location: [place];  Service: Orthopedics;  Laterality: Right;  . TONSILLECTOMY    . TOTAL HIP ARTHROPLASTY Left 01/12/2019   Procedure: TOTAL HIP ARTHROPLASTY ANTERIOR APPROACH;  Surgeon: Paralee Cancel, MD;  Location: [facility];  Service: Orthopedics;  Laterality: Left;  70 mins  . TOTAL KNEE ARTHROPLASTY Right 04/22/2015   Procedure: RIGHT TOTAL KNEE ARTHROPLASTY;  Surgeon: Paralee Cancel, MD;  Location[facility];  Service: Orthopedics;  Laterality: Right;  Marland Kitchen VASECTOMY  1970's    SOCIAL HISTORY: Social History   Socioeconomic History  . Marital status: Married    Spouse name: Not on file  . Number of children: Not on file  . Years of education: Not on file  . Highest education  level: Not on file  Occupational History  . Not on file  Tobacco Use  . Smoking status: Former Smoker    Packs/day: 1.50    Years: 27.00    Pack years: 40.50    Types: Cigarettes    Quit date: 05/18/1981    Years since quitting: 38.9  . Smokeless tobacco: Never Used  Vaping Use  . Vaping Use: Never used  Substance and Sexual Activity  . Alcohol use: Yes    Alcohol/week: 0.0 standard drinks    Comment: daily - 2 beers per day  . Drug use: No  . Sexual activity: Not on file  Other Topics Concern  . Not on file  Social History Narrative  .  Not on file   Social Determinants of Health   Financial Resource Strain:   . Difficulty of Paying Living Expenses: Not on file  Food Insecurity:   . Worried About Charity fundraiser in the Last Year: Not on file  . Ran Out of Food in the Last Year: Not on file  Transportation Needs:   . Lack of Transportation (Medical): Not on file  . Lack of Transportation (Non-Medical): Not on file  Physical Activity:   . Days of Exercise per Week: Not on file  . Minutes of Exercise per Session: Not on file  Stress:   . Feeling of Stress : Not on file  Social Connections:   . Frequency of Communication with Friends and Family: Not on file  . Frequency of Social Gatherings with Friends and Family: Not on file  . Attends Religious Services: Not on file  . Active Member of Clubs or Organizations: Not on file  . Attends Archivist Meetings: Not on file  . Marital Status: Not on file  Intimate Partner Violence:   . Fear of Current or Ex-Partner: Not on file  . Emotionally Abused: Not on file  . Physically Abused: Not on file  . Sexually Abused: Not on file    FAMILY HISTORY: Family History  Problem Relation Age of Onset  . Cancer Mother        colon  . Hypertension Mother   . Cancer Father        lung and bladder    ALLERGIES:  has No Known Allergies.  MEDICATIONS:  Current Outpatient Medications  Medication Sig Dispense Refill   . benzonatate (TESSALON) 100 MG capsule Take 100 mg by mouth 2 (two) times daily as needed for cough. (Patient not taking: Reported on 04/02/2020)    . docusate sodium (COLACE) 100 MG capsule Take 1 capsule (100 mg total) by mouth 2 (two) times daily. 28 capsule 0  . ferrous sulfate (FERROUSUL) 325 (65 FE) MG tablet Take 1 tablet (325 mg total) by mouth 3 (three) times daily with meals for 14 days. 42 tablet 0  . HYDROcodone-acetaminophen (NORCO) 5-325 MG tablet Take 1-2 tablets by mouth every 4 (four) hours as needed for moderate pain or severe pain. (Patient not taking: Reported on 04/02/2020) 60 tablet 0  . Melatonin 10 MG TABS Take 10 mg by mouth at bedtime.    . methocarbamol (ROBAXIN) 500 MG tablet Take 1 tablet (500 mg total) by mouth every 6 (six) hours as needed for muscle spasms. (Patient not taking: Reported on 04/02/2020) 40 tablet 0  . Multiple Vitamins-Minerals (ALIVE MENS ENERGY PO) Take 1 tablet by mouth daily. (Patient not taking: Reported on 04/02/2020)    . omeprazole (PRILOSEC) 20 MG capsule Take 20 mg by mouth daily.     . polyethylene glycol (MIRALAX / GLYCOLAX) 17 g packet Take 17 g by mouth 2 (two) times daily. (Patient taking differently: Take 17 g by mouth daily. ) 28 packet 0  . rosuvastatin (CRESTOR) 10 MG tablet Take 10 mg by mouth daily.    . temazepam (RESTORIL) 15 MG capsule Take 15 mg by mouth at bedtime.     No current facility-administered medications for this visit.    REVIEW OF SYSTEMS:   A 10+ POINT REVIEW OF SYSTEMS WAS OBTAINED including neurology, dermatology, psychiatry, cardiac, respiratory, lymph, extremities, GI, GU, Musculoskeletal, constitutional, breasts, reproductive, HEENT.  All pertinent positives are noted in the HPI.  All others are negative.  PHYSICAL EXAMINATION: ECOG PERFORMANCE STATUS: 0 - Asymptomatic  . There were no vitals filed for this visit. There were no vitals filed for this visit. .There is no height or weight on file to  calculate BMI.  Telehealth visit 04/17/2020  LABORATORY DATA:  I have reviewed the data as listed  . CBC Latest Ref Rng & Units 04/02/2020 04/02/2020 01/13/2019  WBC 4.0 - 10.5 K/uL 3.5(L) - 8.1  Hemoglobin 13.0 - 17.0 g/dL 14.7 - 11.1(L)  Hematocrit 37.5 - 51.0 % 44.5 43.0 34.0(L)  Platelets 150 - 400 K/uL 152 - 157    . CMP Latest Ref Rng & Units 04/02/2020 01/13/2019 06/19/2017  Glucose 70 - 99 mg/dL 95 123(H) 130(H)  BUN 8 - 23 mg/dL 22 19 19   Creatinine 0.61 - 1.24 mg/dL 1.29(H) 1.11 1.23  Sodium 135 - 145 mmol/L 139 137 139  Potassium 3.5 - 5.1 mmol/L 4.5 4.5 4.5  Chloride 98 - 111 mmol/L 106 107 106  CO2 22 - 32 mmol/L 25 24 24   Calcium 8.9 - 10.3 mg/dL 9.4 8.2(L) 8.6(L)  Total Protein 6.5 - 8.1 g/dL 7.8 - -  Total Bilirubin 0.3 - 1.2 mg/dL 0.8 - -  Alkaline Phos 38 - 126 U/L 50 - -  AST 15 - 41 U/L 21 - -  ALT 0 - 44 U/L 15 - -     RADIOGRAPHIC STUDIES: I have personally reviewed the radiological images as listed and agreed with the findings in the report. No results found.  ASSESSMENT & PLAN:   79 yo with   1) Mild Pancytopenia PLAN: -Discussed pt labwork, 04/02/20; RBC & PLT have normalized, WBC is low-normal, LDH is borderline elevated; Ferritin, Copper, Vitamin D, Vitamin B12, Immature PLT Fract, & Folic acid are WNL; no M Protein observed.  -Advised pt that pancytopenia could have been caused by recent immunizations, a passing viral infection, or alcohol use.  -Recommend pt continue reduced alcohol intake  -Continue daily Vitamin B12 and Vitamin D -Will see back as needed   FOLLOW UP: RTC with Dr. Irene Limbo as needed   The total time spent in the appt was 20 minutes and more than 50% was on counseling and direct patient cares.  All of the patient's questions were answered with apparent satisfaction. The patient knows to call the clinic with any problems, questions or concerns.    Sullivan Lone MD [place] AAHIVMS [facility] Hematology/Oncology Physician [facility]  (Office):       [phone] (Work cell):  [phone] (Fax):           [phone]  04/17/2020 3:14 PM  I, Yevette Edwards, am acting as a scribe for Dr. Sullivan Lone.   .I have reviewed the above documentation for accuracy and completeness, and I agree with the above. Brunetta Genera MD

## 2020-09-11 DIAGNOSIS — H906 Mixed conductive and sensorineural hearing loss, bilateral: Secondary | ICD-10-CM | POA: Diagnosis not present

## 2020-09-11 DIAGNOSIS — Z974 Presence of external hearing-aid: Secondary | ICD-10-CM | POA: Diagnosis not present

## 2020-09-11 DIAGNOSIS — H6123 Impacted cerumen, bilateral: Secondary | ICD-10-CM | POA: Diagnosis not present

## 2020-09-11 DIAGNOSIS — H9212 Otorrhea, left ear: Secondary | ICD-10-CM | POA: Diagnosis not present

## 2020-09-26 DIAGNOSIS — L812 Freckles: Secondary | ICD-10-CM | POA: Diagnosis not present

## 2020-09-26 DIAGNOSIS — L821 Other seborrheic keratosis: Secondary | ICD-10-CM | POA: Diagnosis not present

## 2020-09-26 DIAGNOSIS — D225 Melanocytic nevi of trunk: Secondary | ICD-10-CM | POA: Diagnosis not present

## 2020-09-26 DIAGNOSIS — D2261 Melanocytic nevi of right upper limb, including shoulder: Secondary | ICD-10-CM | POA: Diagnosis not present

## 2020-10-15 DIAGNOSIS — H6123 Impacted cerumen, bilateral: Secondary | ICD-10-CM | POA: Diagnosis not present

## 2020-10-15 DIAGNOSIS — H906 Mixed conductive and sensorineural hearing loss, bilateral: Secondary | ICD-10-CM | POA: Diagnosis not present

## 2020-10-15 DIAGNOSIS — H9212 Otorrhea, left ear: Secondary | ICD-10-CM | POA: Diagnosis not present

## 2020-11-14 DIAGNOSIS — N5201 Erectile dysfunction due to arterial insufficiency: Secondary | ICD-10-CM | POA: Diagnosis not present

## 2020-11-14 DIAGNOSIS — Z8546 Personal history of malignant neoplasm of prostate: Secondary | ICD-10-CM | POA: Diagnosis not present

## 2021-01-08 DIAGNOSIS — H43813 Vitreous degeneration, bilateral: Secondary | ICD-10-CM | POA: Diagnosis not present

## 2021-01-08 DIAGNOSIS — H18592 Other hereditary corneal dystrophies, left eye: Secondary | ICD-10-CM | POA: Diagnosis not present

## 2021-01-08 DIAGNOSIS — Z961 Presence of intraocular lens: Secondary | ICD-10-CM | POA: Diagnosis not present

## 2021-02-25 DIAGNOSIS — R051 Acute cough: Secondary | ICD-10-CM | POA: Diagnosis not present

## 2021-02-25 DIAGNOSIS — J309 Allergic rhinitis, unspecified: Secondary | ICD-10-CM | POA: Diagnosis not present

## 2021-02-25 DIAGNOSIS — Z1152 Encounter for screening for COVID-19: Secondary | ICD-10-CM | POA: Diagnosis not present

## 2021-02-25 DIAGNOSIS — R5383 Other fatigue: Secondary | ICD-10-CM | POA: Diagnosis not present

## 2021-02-27 DIAGNOSIS — E785 Hyperlipidemia, unspecified: Secondary | ICD-10-CM | POA: Diagnosis not present

## 2021-02-27 DIAGNOSIS — R7301 Impaired fasting glucose: Secondary | ICD-10-CM | POA: Diagnosis not present

## 2021-02-27 DIAGNOSIS — Z125 Encounter for screening for malignant neoplasm of prostate: Secondary | ICD-10-CM | POA: Diagnosis not present

## 2021-03-07 DIAGNOSIS — R7301 Impaired fasting glucose: Secondary | ICD-10-CM | POA: Diagnosis not present

## 2021-03-07 DIAGNOSIS — E785 Hyperlipidemia, unspecified: Secondary | ICD-10-CM | POA: Diagnosis not present

## 2021-03-07 DIAGNOSIS — R051 Acute cough: Secondary | ICD-10-CM | POA: Diagnosis not present

## 2021-03-07 DIAGNOSIS — R82998 Other abnormal findings in urine: Secondary | ICD-10-CM | POA: Diagnosis not present

## 2021-03-07 DIAGNOSIS — Z0000 Encounter for general adult medical examination without abnormal findings: Secondary | ICD-10-CM | POA: Diagnosis not present

## 2021-03-13 DIAGNOSIS — Z1212 Encounter for screening for malignant neoplasm of rectum: Secondary | ICD-10-CM | POA: Diagnosis not present

## 2021-04-01 DIAGNOSIS — E785 Hyperlipidemia, unspecified: Secondary | ICD-10-CM | POA: Diagnosis not present

## 2021-04-17 DIAGNOSIS — Z85038 Personal history of other malignant neoplasm of large intestine: Secondary | ICD-10-CM | POA: Diagnosis not present

## 2021-04-17 DIAGNOSIS — D122 Benign neoplasm of ascending colon: Secondary | ICD-10-CM | POA: Diagnosis not present

## 2021-04-17 DIAGNOSIS — K5730 Diverticulosis of large intestine without perforation or abscess without bleeding: Secondary | ICD-10-CM | POA: Diagnosis not present

## 2021-04-21 DIAGNOSIS — D122 Benign neoplasm of ascending colon: Secondary | ICD-10-CM | POA: Diagnosis not present

## 2021-07-20 IMAGING — RF DG PORTABLE PELVIS
1 series · 2 of 2 positions shown · non-contrast
Comparison: 01/12/2019

CLINICAL DATA: Right hip replacement

EXAM:
PORTABLE PELVIS 1-2 VIEWS

[Series 1: run · 2 of 2 slices shown]
[im 1/2]
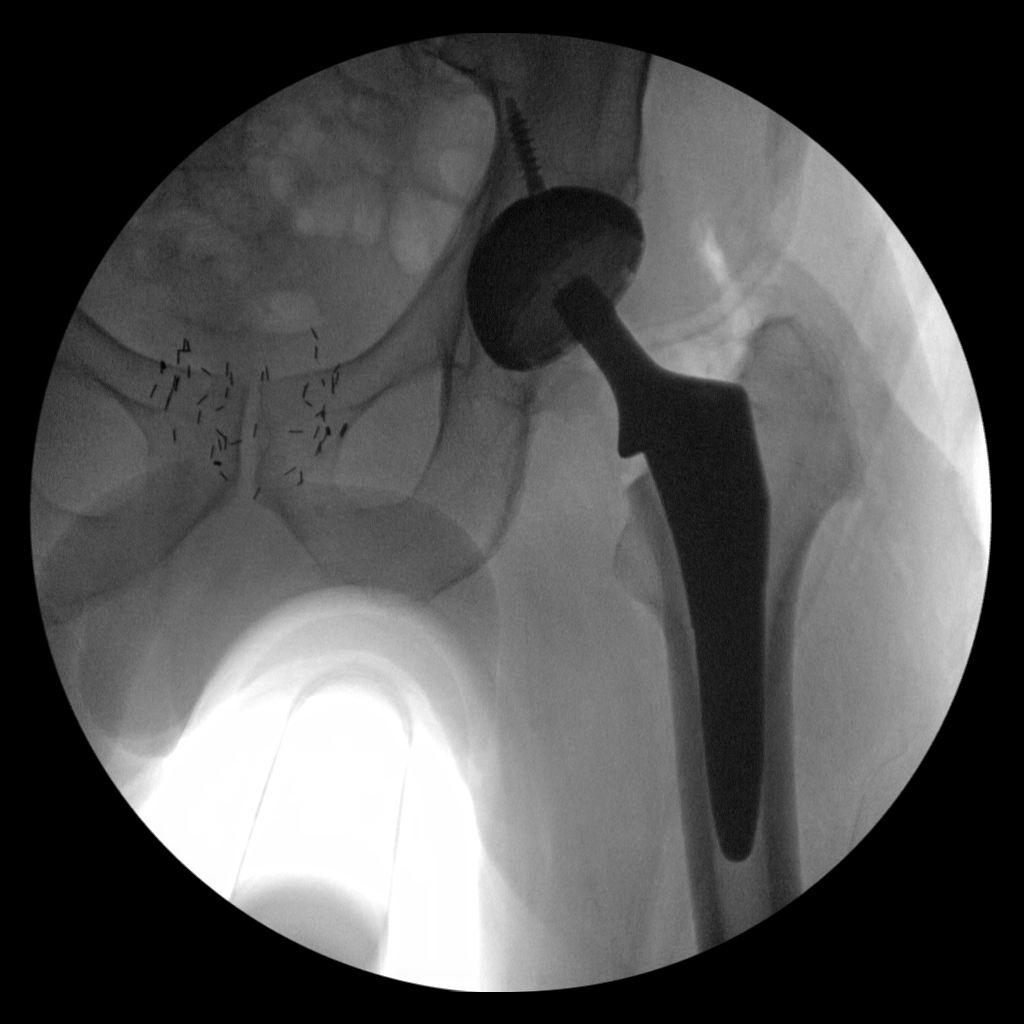
[im 2/2]
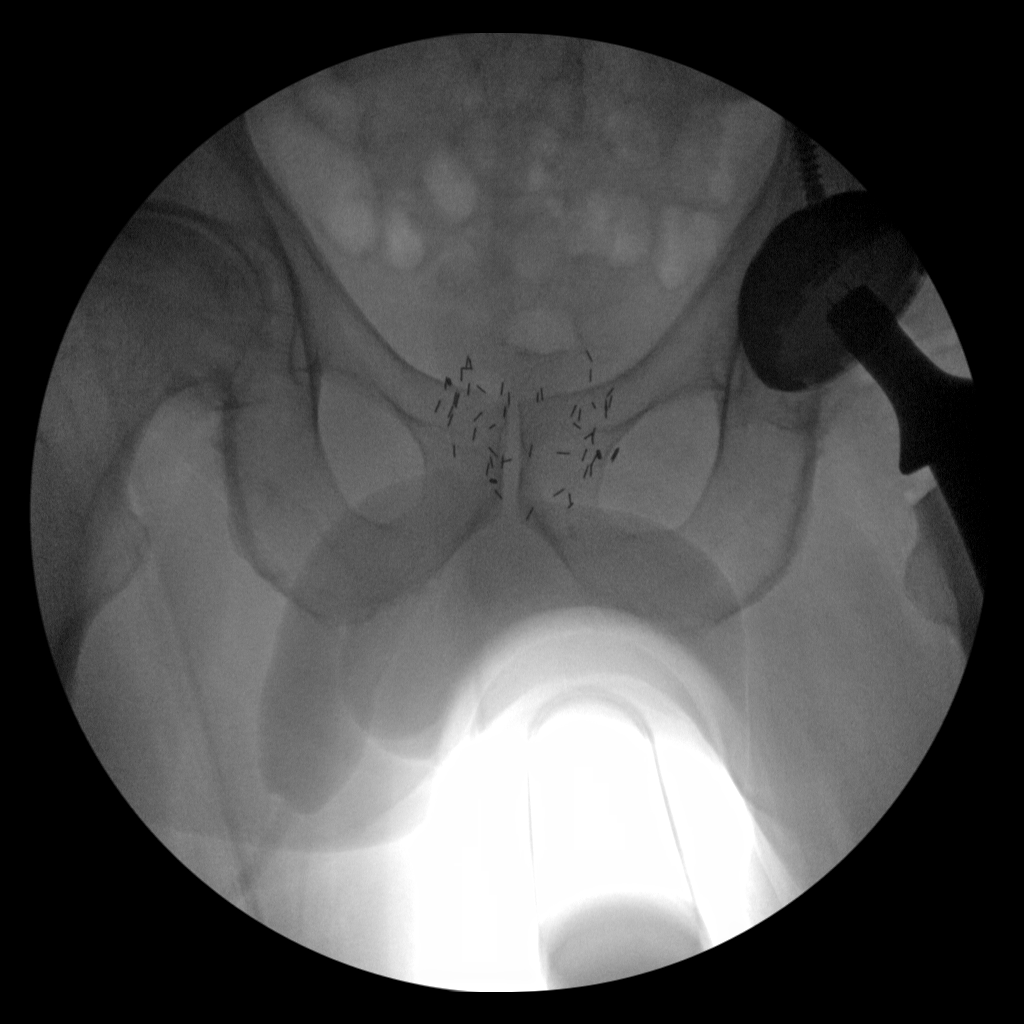

[2 of 2 positions shown; findings below may reference images not displayed]

FINDINGS: Right total hip arthroplasty changes noted. Normal alignment in the
frontal plane. No acute osseous finding or hardware abnormality.
Bones are osteopenic. Degenerative changes of the lumbar spine and
right hip. Radiation brachytherapy seeds noted in the prostate
region. Expected changes of the left hip region soft tissues.
IMPRESSION: Status post left total hip arthroplasty. No complicating feature by
plain radiography.

Degenerative changes of the lumbar spine and right hip as well.

## 2021-07-20 IMAGING — RF OPERATIVE LEFT HIP WITH PELVIS
1 series · 2 of 2 positions shown · non-contrast
Comparison: None.

CLINICAL DATA: Left total hip replacement.

EXAM:
OPERATIVE left HIP (WITH PELVIS IF PERFORMED) 2 VIEWS
TECHNIQUE: Fluoroscopic spot image(s) were submitted for interpretation
post-operatively.
FLUOROSCOPY TIME:  14 seconds.

[Series 1: run · 2 of 2 slices shown]
[im 1/2]
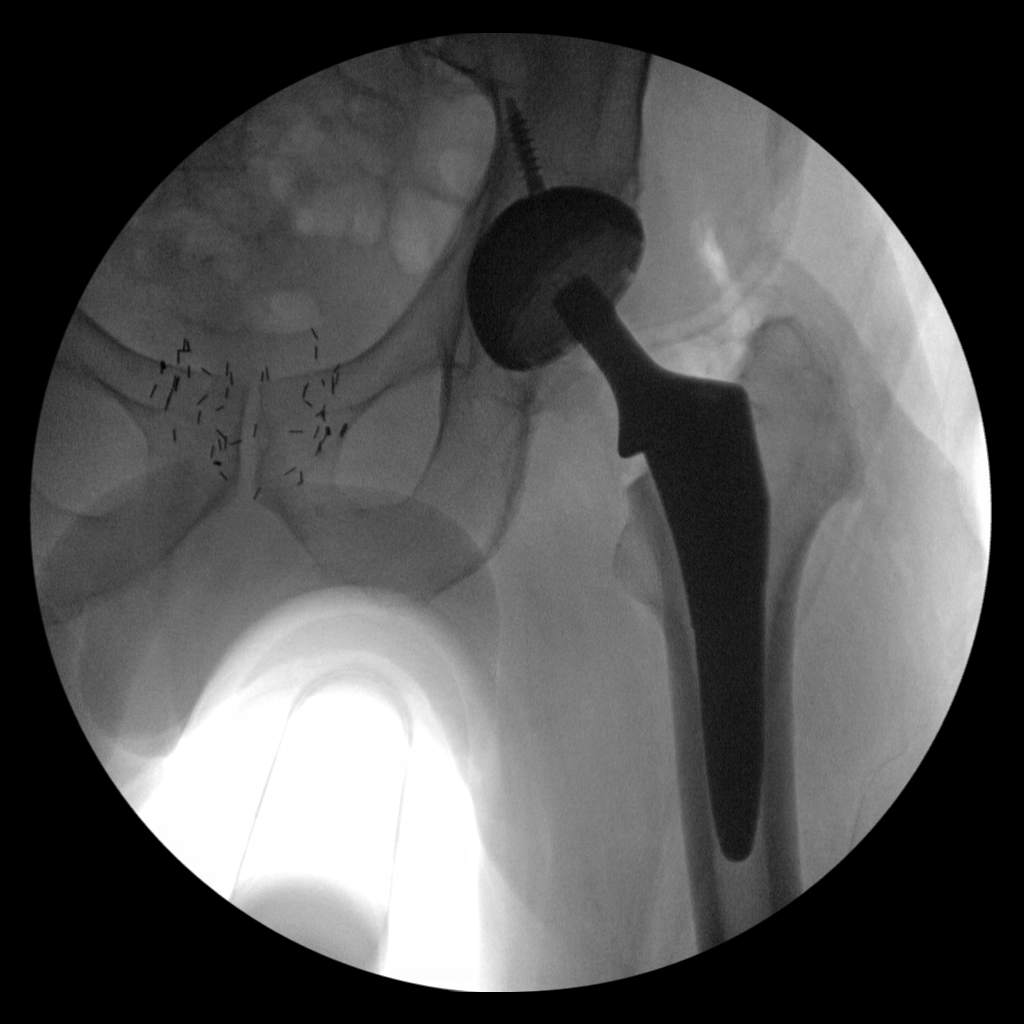
[im 2/2]
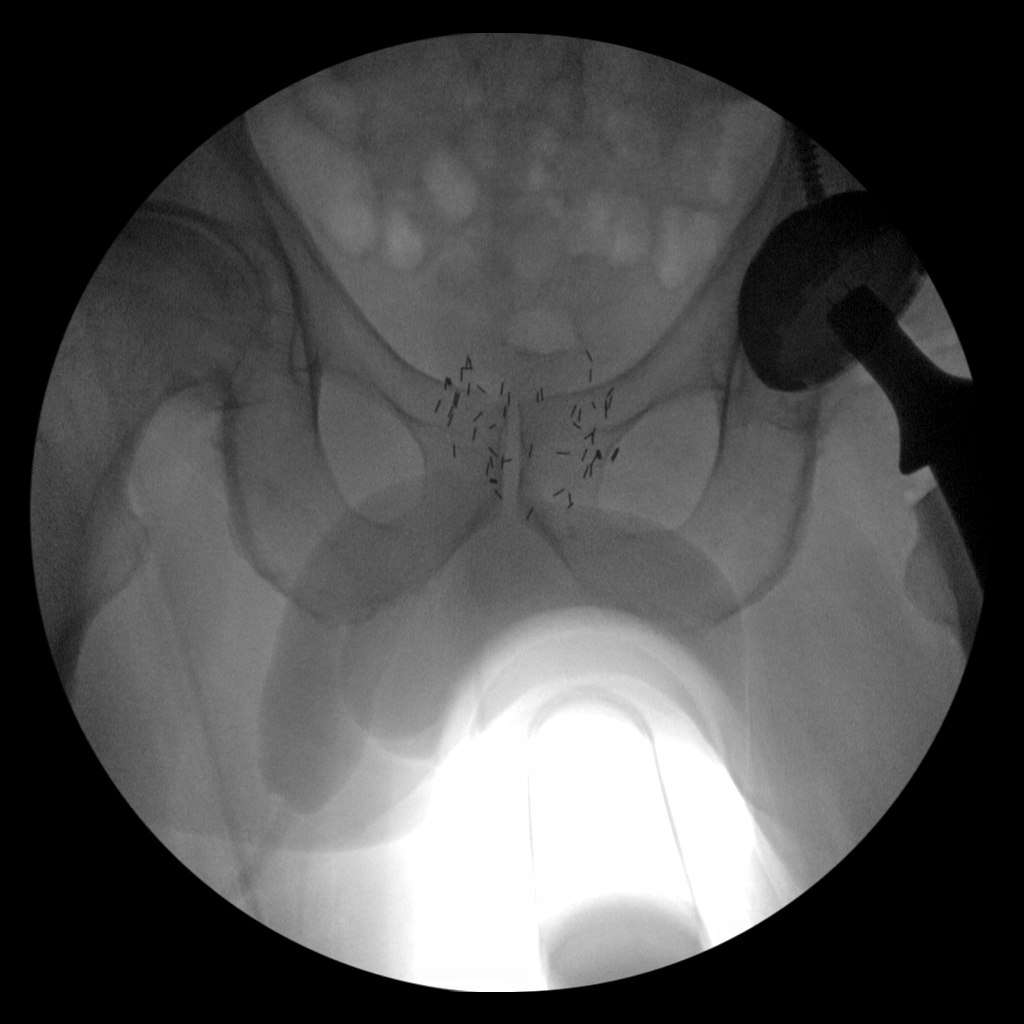

[2 of 2 positions shown; findings below may reference images not displayed]

FINDINGS: Two intraoperative fluoroscopic images were obtained of the left
hip. The femoral and acetabular components appear to be well
situated.
IMPRESSION: Fluoroscopic guidance provided during left total hip arthroplasty.

## 2021-10-16 DIAGNOSIS — D1801 Hemangioma of skin and subcutaneous tissue: Secondary | ICD-10-CM | POA: Diagnosis not present

## 2021-10-16 DIAGNOSIS — L720 Epidermal cyst: Secondary | ICD-10-CM | POA: Diagnosis not present

## 2021-10-16 DIAGNOSIS — L821 Other seborrheic keratosis: Secondary | ICD-10-CM | POA: Diagnosis not present

## 2021-10-16 DIAGNOSIS — L812 Freckles: Secondary | ICD-10-CM | POA: Diagnosis not present

## 2022-01-14 DIAGNOSIS — Z961 Presence of intraocular lens: Secondary | ICD-10-CM | POA: Diagnosis not present

## 2022-01-14 DIAGNOSIS — D23112 Other benign neoplasm of skin of right lower eyelid, including canthus: Secondary | ICD-10-CM | POA: Diagnosis not present

## 2022-01-14 DIAGNOSIS — H43813 Vitreous degeneration, bilateral: Secondary | ICD-10-CM | POA: Diagnosis not present

## 2022-01-20 DIAGNOSIS — H6123 Impacted cerumen, bilateral: Secondary | ICD-10-CM | POA: Diagnosis not present

## 2022-01-20 DIAGNOSIS — H906 Mixed conductive and sensorineural hearing loss, bilateral: Secondary | ICD-10-CM | POA: Diagnosis not present

## 2022-03-12 DIAGNOSIS — E785 Hyperlipidemia, unspecified: Secondary | ICD-10-CM | POA: Diagnosis not present

## 2022-03-12 DIAGNOSIS — Z125 Encounter for screening for malignant neoplasm of prostate: Secondary | ICD-10-CM | POA: Diagnosis not present

## 2022-03-12 DIAGNOSIS — R7301 Impaired fasting glucose: Secondary | ICD-10-CM | POA: Diagnosis not present

## 2022-03-12 DIAGNOSIS — R7989 Other specified abnormal findings of blood chemistry: Secondary | ICD-10-CM | POA: Diagnosis not present

## 2022-03-19 DIAGNOSIS — R82998 Other abnormal findings in urine: Secondary | ICD-10-CM | POA: Diagnosis not present

## 2022-03-19 DIAGNOSIS — C189 Malignant neoplasm of colon, unspecified: Secondary | ICD-10-CM | POA: Diagnosis not present

## 2022-03-19 DIAGNOSIS — N1831 Chronic kidney disease, stage 3a: Secondary | ICD-10-CM | POA: Diagnosis not present

## 2022-03-19 DIAGNOSIS — C61 Malignant neoplasm of prostate: Secondary | ICD-10-CM | POA: Diagnosis not present

## 2022-03-19 DIAGNOSIS — Z23 Encounter for immunization: Secondary | ICD-10-CM | POA: Diagnosis not present

## 2022-03-19 DIAGNOSIS — Z0000 Encounter for general adult medical examination without abnormal findings: Secondary | ICD-10-CM | POA: Diagnosis not present

## 2022-05-27 DIAGNOSIS — K080 Exfoliation of teeth due to systemic causes: Secondary | ICD-10-CM | POA: Diagnosis not present

## 2022-06-16 DIAGNOSIS — M7130 Other bursal cyst, unspecified site: Secondary | ICD-10-CM | POA: Diagnosis not present

## 2022-09-17 DIAGNOSIS — K219 Gastro-esophageal reflux disease without esophagitis: Secondary | ICD-10-CM | POA: Diagnosis not present

## 2022-09-17 DIAGNOSIS — E785 Hyperlipidemia, unspecified: Secondary | ICD-10-CM | POA: Diagnosis not present

## 2022-09-17 DIAGNOSIS — R7989 Other specified abnormal findings of blood chemistry: Secondary | ICD-10-CM | POA: Diagnosis not present

## 2022-09-22 DIAGNOSIS — R7301 Impaired fasting glucose: Secondary | ICD-10-CM | POA: Diagnosis not present

## 2022-09-22 DIAGNOSIS — C189 Malignant neoplasm of colon, unspecified: Secondary | ICD-10-CM | POA: Diagnosis not present

## 2022-09-22 DIAGNOSIS — D61818 Other pancytopenia: Secondary | ICD-10-CM | POA: Diagnosis not present

## 2022-09-22 DIAGNOSIS — K080 Exfoliation of teeth due to systemic causes: Secondary | ICD-10-CM | POA: Diagnosis not present

## 2022-09-22 DIAGNOSIS — C61 Malignant neoplasm of prostate: Secondary | ICD-10-CM | POA: Diagnosis not present

## 2022-09-22 DIAGNOSIS — N1831 Chronic kidney disease, stage 3a: Secondary | ICD-10-CM | POA: Diagnosis not present

## 2022-10-14 DIAGNOSIS — Z96642 Presence of left artificial hip joint: Secondary | ICD-10-CM | POA: Diagnosis not present

## 2022-10-14 DIAGNOSIS — M1712 Unilateral primary osteoarthritis, left knee: Secondary | ICD-10-CM | POA: Diagnosis not present

## 2022-10-14 DIAGNOSIS — Z96651 Presence of right artificial knee joint: Secondary | ICD-10-CM | POA: Diagnosis not present

## 2022-10-14 DIAGNOSIS — M25562 Pain in left knee: Secondary | ICD-10-CM | POA: Diagnosis not present

## 2022-11-24 DIAGNOSIS — I788 Other diseases of capillaries: Secondary | ICD-10-CM | POA: Diagnosis not present

## 2022-11-24 DIAGNOSIS — L812 Freckles: Secondary | ICD-10-CM | POA: Diagnosis not present

## 2022-11-24 DIAGNOSIS — L821 Other seborrheic keratosis: Secondary | ICD-10-CM | POA: Diagnosis not present

## 2022-11-24 DIAGNOSIS — D485 Neoplasm of uncertain behavior of skin: Secondary | ICD-10-CM | POA: Diagnosis not present

## 2022-11-24 DIAGNOSIS — D225 Melanocytic nevi of trunk: Secondary | ICD-10-CM | POA: Diagnosis not present

## 2022-11-24 DIAGNOSIS — L570 Actinic keratosis: Secondary | ICD-10-CM | POA: Diagnosis not present

## 2023-01-19 DIAGNOSIS — H0100B Unspecified blepharitis left eye, upper and lower eyelids: Secondary | ICD-10-CM | POA: Diagnosis not present

## 2023-01-19 DIAGNOSIS — H43813 Vitreous degeneration, bilateral: Secondary | ICD-10-CM | POA: Diagnosis not present

## 2023-01-19 DIAGNOSIS — H04211 Epiphora due to excess lacrimation, right lacrimal gland: Secondary | ICD-10-CM | POA: Diagnosis not present

## 2023-01-19 DIAGNOSIS — H0100A Unspecified blepharitis right eye, upper and lower eyelids: Secondary | ICD-10-CM | POA: Diagnosis not present

## 2023-01-27 DIAGNOSIS — K080 Exfoliation of teeth due to systemic causes: Secondary | ICD-10-CM | POA: Diagnosis not present

## 2023-03-22 DIAGNOSIS — H6123 Impacted cerumen, bilateral: Secondary | ICD-10-CM | POA: Diagnosis not present

## 2023-03-23 DIAGNOSIS — Z125 Encounter for screening for malignant neoplasm of prostate: Secondary | ICD-10-CM | POA: Diagnosis not present

## 2023-03-23 DIAGNOSIS — D649 Anemia, unspecified: Secondary | ICD-10-CM | POA: Diagnosis not present

## 2023-03-23 DIAGNOSIS — E785 Hyperlipidemia, unspecified: Secondary | ICD-10-CM | POA: Diagnosis not present

## 2023-03-23 DIAGNOSIS — Z1389 Encounter for screening for other disorder: Secondary | ICD-10-CM | POA: Diagnosis not present

## 2023-03-23 DIAGNOSIS — R7301 Impaired fasting glucose: Secondary | ICD-10-CM | POA: Diagnosis not present

## 2023-03-30 DIAGNOSIS — Z1331 Encounter for screening for depression: Secondary | ICD-10-CM | POA: Diagnosis not present

## 2023-03-30 DIAGNOSIS — Z23 Encounter for immunization: Secondary | ICD-10-CM | POA: Diagnosis not present

## 2023-03-30 DIAGNOSIS — Z0000 Encounter for general adult medical examination without abnormal findings: Secondary | ICD-10-CM | POA: Diagnosis not present

## 2023-03-30 DIAGNOSIS — I10 Essential (primary) hypertension: Secondary | ICD-10-CM | POA: Diagnosis not present

## 2023-03-30 DIAGNOSIS — Z1339 Encounter for screening examination for other mental health and behavioral disorders: Secondary | ICD-10-CM | POA: Diagnosis not present

## 2023-03-30 DIAGNOSIS — C189 Malignant neoplasm of colon, unspecified: Secondary | ICD-10-CM | POA: Diagnosis not present

## 2023-09-16 DIAGNOSIS — R7301 Impaired fasting glucose: Secondary | ICD-10-CM | POA: Diagnosis not present

## 2023-09-16 DIAGNOSIS — E785 Hyperlipidemia, unspecified: Secondary | ICD-10-CM | POA: Diagnosis not present

## 2023-09-21 DIAGNOSIS — E785 Hyperlipidemia, unspecified: Secondary | ICD-10-CM | POA: Diagnosis not present

## 2023-09-21 DIAGNOSIS — C189 Malignant neoplasm of colon, unspecified: Secondary | ICD-10-CM | POA: Diagnosis not present

## 2023-12-08 DIAGNOSIS — L821 Other seborrheic keratosis: Secondary | ICD-10-CM | POA: Diagnosis not present

## 2023-12-08 DIAGNOSIS — L723 Sebaceous cyst: Secondary | ICD-10-CM | POA: Diagnosis not present

## 2023-12-08 DIAGNOSIS — D0462 Carcinoma in situ of skin of left upper limb, including shoulder: Secondary | ICD-10-CM | POA: Diagnosis not present

## 2023-12-08 DIAGNOSIS — D2272 Melanocytic nevi of left lower limb, including hip: Secondary | ICD-10-CM | POA: Diagnosis not present

## 2023-12-08 DIAGNOSIS — D2271 Melanocytic nevi of right lower limb, including hip: Secondary | ICD-10-CM | POA: Diagnosis not present

## 2024-01-20 DIAGNOSIS — H524 Presbyopia: Secondary | ICD-10-CM | POA: Diagnosis not present

## 2024-01-26 DIAGNOSIS — M9905 Segmental and somatic dysfunction of pelvic region: Secondary | ICD-10-CM | POA: Diagnosis not present

## 2024-01-26 DIAGNOSIS — M9904 Segmental and somatic dysfunction of sacral region: Secondary | ICD-10-CM | POA: Diagnosis not present

## 2024-01-26 DIAGNOSIS — M9903 Segmental and somatic dysfunction of lumbar region: Secondary | ICD-10-CM | POA: Diagnosis not present

## 2024-01-27 DIAGNOSIS — M9904 Segmental and somatic dysfunction of sacral region: Secondary | ICD-10-CM | POA: Diagnosis not present

## 2024-01-27 DIAGNOSIS — M9903 Segmental and somatic dysfunction of lumbar region: Secondary | ICD-10-CM | POA: Diagnosis not present

## 2024-01-27 DIAGNOSIS — M9905 Segmental and somatic dysfunction of pelvic region: Secondary | ICD-10-CM | POA: Diagnosis not present

## 2024-01-31 DIAGNOSIS — M9904 Segmental and somatic dysfunction of sacral region: Secondary | ICD-10-CM | POA: Diagnosis not present

## 2024-01-31 DIAGNOSIS — M9903 Segmental and somatic dysfunction of lumbar region: Secondary | ICD-10-CM | POA: Diagnosis not present

## 2024-01-31 DIAGNOSIS — M9905 Segmental and somatic dysfunction of pelvic region: Secondary | ICD-10-CM | POA: Diagnosis not present

## 2024-03-29 DIAGNOSIS — D649 Anemia, unspecified: Secondary | ICD-10-CM | POA: Diagnosis not present

## 2024-03-29 DIAGNOSIS — E785 Hyperlipidemia, unspecified: Secondary | ICD-10-CM | POA: Diagnosis not present

## 2024-04-05 DIAGNOSIS — Z1339 Encounter for screening examination for other mental health and behavioral disorders: Secondary | ICD-10-CM | POA: Diagnosis not present

## 2024-04-05 DIAGNOSIS — Z1331 Encounter for screening for depression: Secondary | ICD-10-CM | POA: Diagnosis not present

## 2024-04-05 DIAGNOSIS — R82998 Other abnormal findings in urine: Secondary | ICD-10-CM | POA: Diagnosis not present

## 2024-04-05 DIAGNOSIS — Z23 Encounter for immunization: Secondary | ICD-10-CM | POA: Diagnosis not present

## 2024-04-05 DIAGNOSIS — Z0000 Encounter for general adult medical examination without abnormal findings: Secondary | ICD-10-CM | POA: Diagnosis not present

## 2024-04-05 DIAGNOSIS — N1831 Chronic kidney disease, stage 3a: Secondary | ICD-10-CM | POA: Diagnosis not present
